# Patient Record
Sex: Female | Born: 1990 | Race: Black or African American | Hispanic: No | Marital: Single | Smoking: Never smoker
Health system: Southern US, Community
[De-identification: ages and names within clinical notes are randomized; demographics above are authoritative.]

## PROBLEM LIST (undated history)

## (undated) ENCOUNTER — Inpatient Hospital Stay (HOSPITAL_COMMUNITY): Payer: Self-pay

## (undated) DIAGNOSIS — A6000 Herpesviral infection of urogenital system, unspecified: Secondary | ICD-10-CM

## (undated) DIAGNOSIS — B9689 Other specified bacterial agents as the cause of diseases classified elsewhere: Secondary | ICD-10-CM

## (undated) DIAGNOSIS — R5600 Simple febrile convulsions: Secondary | ICD-10-CM

## (undated) DIAGNOSIS — N760 Acute vaginitis: Secondary | ICD-10-CM

---
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
## 2012-05-30 ENCOUNTER — Emergency Department (HOSPITAL_BASED_OUTPATIENT_CLINIC_OR_DEPARTMENT_OTHER)
Admission: EM | Admit: 2012-05-30 | Discharge: 2012-05-30 | Disposition: A | Discharge status: Home / Self Care | Payer: PPO | Attending: Emergency Medicine | Admitting: Emergency Medicine

## 2012-05-30 ENCOUNTER — Encounter (HOSPITAL_BASED_OUTPATIENT_CLINIC_OR_DEPARTMENT_OTHER): Payer: Self-pay | Admitting: Emergency Medicine

## 2012-05-30 DIAGNOSIS — N760 Acute vaginitis: Secondary | ICD-10-CM

## 2012-05-30 LAB — GC/CHLAMYDIA PROBE AMP: CT Probe RNA: NEGATIVE

## 2012-05-30 LAB — WET PREP, GENITAL: Yeast Wet Prep HPF POC: NONE SEEN

## 2012-05-30 MED ORDER — METRONIDAZOLE 500 MG PO TABS: 500.0000 mg | ORAL_TABLET | Freq: Two times a day (BID) | ORAL | Status: DC

## 2012-05-30 MED ORDER — METRONIDAZOLE IN NACL 5-0.79 MG/ML-% IV SOLN: 500.0000 mg | Freq: Once | INTRAVENOUS | Status: DC

## 2012-05-30 MED ORDER — METRONIDAZOLE 500 MG PO TABS
500.0000 mg | ORAL_TABLET | Freq: Once | ORAL | Status: AC
  Administered 2012-05-30: 500 mg via ORAL
  Filled 2012-05-30: qty 1

## 2012-05-30 NOTE — ED Notes (Signed)
Pt c/o vaginal discharge starting today.

## 2012-05-30 NOTE — ED Provider Notes (Signed)
History     CSN: [id number]  Arrival date & time 05/30/12  1324   First MD Initiated Contact with Patient 05/30/12 0057      Chief Complaint  Patient presents with  . Vaginal Discharge    (Consider location/radiation/quality/duration/timing/severity/associated sxs/prior treatment) HPI This is a 21 year old female complains of a one-day history of a malodorous white vaginal discharge. She states she gets this frequently and has been told that it is bacterial vaginosis. She has some mild associated pelvic pain which she also typically gets along with the bacterial vaginosis. She denies dysuria or vaginal bleeding.  History reviewed. No pertinent past medical history.  History reviewed. No pertinent past surgical history.  No family history on file.  History  Substance Use Topics  . Smoking status: Never Smoker   . Smokeless tobacco: Not on file  . Alcohol Use: Yes    OB History    Grav Para Term Preterm Abortions TAB SAB Ect Mult Living                  Review of Systems  All other systems reviewed and are negative.    Allergies  Review of patient's allergies indicates no known allergies.  Home Medications   Current Outpatient Rx  Name  Route  Sig  Dispense  Refill  . OVER THE COUNTER MEDICATION   Oral   Take 1 packet by mouth daily as needed. For pain            BP 105/53  Pulse 58  Temp 98.4 F (36.9 C) (Oral)  Resp 18  Ht 5\' 5"  (1.651 m)  Wt 170 lb (77.111 kg)  BMI 28.29 kg/m2  SpO2 98%  Physical Exam General: Well-developed, well-nourished female in no acute distress; appearance consistent with age of record HENT: normocephalic, atraumatic Eyes: Normal appearance Neck: supple Heart: regular rate and rhythm Lungs: clear to auscultation bilaterally Abdomen: soft; nondistended; mild suprapubic tenderness; bowel sounds present GU: Normal external genitalia; the vaginal bleeding; white vaginal discharge; no cervical motion tenderness; mild  bilateral adnexal tenderness Extremities: No deformity; full range of motion Neurologic: Awake, alert and oriented; motor function intact in all extremities and symmetric; no facial droop Skin: Warm and dry Psychiatric: Normal mood and affect    ED Course  Procedures (including critical care time)     MDM   Nursing notes and vitals signs, including pulse oximetry, reviewed.  Summary of this visit's results, reviewed by myself:  Labs:  Results for orders placed during the hospital encounter of 05/30/12 (from the past 24 hour(s))  WET PREP, GENITAL     Status: Abnormal   Collection Time   05/30/12  1:02 AM      Component Value Range   Yeast Wet Prep HPF POC NONE SEEN  NONE SEEN   Trich, Wet Prep NONE SEEN  NONE SEEN   Clue Cells Wet Prep HPF POC MODERATE (*) NONE SEEN   WBC, Wet Prep HPF POC FEW (*) NONE SEEN            Hanley Seamen, MD 05/30/12 0121

## 2012-05-30 NOTE — ED Notes (Signed)
Pt states she has recurrent BV. Attempted to educate pt about cause and prevention of BV. Pt not receptive to information given and states "you don't know what your talking about". No further attempt at education at this time.

## 2012-08-07 ENCOUNTER — Emergency Department (HOSPITAL_BASED_OUTPATIENT_CLINIC_OR_DEPARTMENT_OTHER)
Admission: EM | Admit: 2012-08-07 | Discharge: 2012-08-07 | Disposition: A | Discharge status: Home / Self Care | Payer: 59 | Attending: Emergency Medicine | Admitting: Emergency Medicine

## 2012-08-07 ENCOUNTER — Encounter (HOSPITAL_BASED_OUTPATIENT_CLINIC_OR_DEPARTMENT_OTHER): Payer: Self-pay | Admitting: *Deleted

## 2012-08-07 DIAGNOSIS — B9689 Other specified bacterial agents as the cause of diseases classified elsewhere: Secondary | ICD-10-CM

## 2012-08-07 DIAGNOSIS — N760 Acute vaginitis: Secondary | ICD-10-CM | POA: Insufficient documentation

## 2012-08-07 DIAGNOSIS — Z8669 Personal history of other diseases of the nervous system and sense organs: Secondary | ICD-10-CM | POA: Insufficient documentation

## 2012-08-07 HISTORY — DX: Other specified bacterial agents as the cause of diseases classified elsewhere: B96.89

## 2012-08-07 HISTORY — DX: Other specified bacterial agents as the cause of diseases classified elsewhere: N76.0

## 2012-08-07 HISTORY — DX: Simple febrile convulsions: R56.00

## 2012-08-07 LAB — WET PREP, GENITAL: Trich, Wet Prep: NONE SEEN

## 2012-08-07 MED ORDER — METRONIDAZOLE 0.75 % VA GEL: 1.0000 | Freq: Two times a day (BID) | VAGINAL | Status: DC

## 2012-08-07 NOTE — ED Notes (Signed)
Pt is unable to void. 

## 2012-08-07 NOTE — ED Provider Notes (Signed)
History     CSN: [id number]  Arrival date & time 08/07/12  1709   First MD Initiated Contact with Patient 08/07/12 1832      Chief Complaint  Patient presents with  . Vaginal Discharge    (Consider location/radiation/quality/duration/timing/severity/associated sxs/prior treatment) Patient is a 22 y.o. female presenting with vaginal discharge. The history is provided by the patient. No language interpreter was used.  Vaginal Discharge This is a new problem. The current episode started today. The problem occurs constantly. The problem has been unchanged. Nothing aggravates the symptoms. She has tried nothing for the symptoms. The treatment provided mild relief.   Pt complains of a thick white discharge.  Past Medical History  Diagnosis Date  . BV (bacterial vaginosis)   . Febrile seizure     History reviewed. No pertinent past surgical history.  No family history on file.  History  Substance Use Topics  . Smoking status: Never Smoker   . Smokeless tobacco: Not on file  . Alcohol Use: Yes    OB History    Grav Para Term Preterm Abortions TAB SAB Ect Mult Living                  Review of Systems  Genitourinary: Positive for vaginal discharge.  All other systems reviewed and are negative.    Allergies  Review of patient's allergies indicates no known allergies.  Home Medications   Current Outpatient Rx  Name  Route  Sig  Dispense  Refill  . METRONIDAZOLE 500 MG PO TABS   Oral   Take 1 tablet (500 mg total) by mouth 2 (two) times daily.   14 tablet   0   . OVER THE COUNTER MEDICATION   Oral   Take 1 packet by mouth daily as needed. For pain            BP 130/54  Temp 98.1 F (36.7 C) (Oral)  Resp 20  Ht 5\' 6"  (1.676 m)  Wt 170 lb (77.111 kg)  BMI 27.44 kg/m2  SpO2 100%  Physical Exam  Nursing note and vitals reviewed. Constitutional: She appears well-developed and well-nourished.  HENT:  Head: Normocephalic.  Eyes: Pupils are equal, round,  and reactive to light.  Neck: Normal range of motion.  Cardiovascular: Normal rate, regular rhythm and normal heart sounds.   Pulmonary/Chest: Effort normal and breath sounds normal.  Abdominal: Soft.  Genitourinary: Vaginal discharge found.       Scant white vaginal discharge  Musculoskeletal: Normal range of motion.  Neurological: She is alert.  Skin: Skin is warm.  Psychiatric: She has a normal mood and affect.    ED Course  Procedures (including critical care time)   Labs Reviewed  URINALYSIS, ROUTINE W REFLEX MICROSCOPIC  PREGNANCY, URINE   No results found.   No diagnosis found.    MDM   Pt was on flagyl 2 weeks ago.   Pt reports metrogel has worked better in the past       Lonia Skinner [place], [place] 08/07/12 1909

## 2012-08-07 NOTE — ED Notes (Addendum)
Patient states she noticed she has a cloudy white vaginal discharge this morning.  States she has a hx of BV and was seen and tx by her PCP 2-3 weeks ago for same.  Denies pain or urinary problems.

## 2012-08-08 LAB — GC/CHLAMYDIA PROBE AMP: GC Probe RNA: NEGATIVE

## 2012-08-11 NOTE — ED Provider Notes (Signed)
History/physical exam/procedure(s) were performed by non-physician practitioner and as supervising physician I was immediately available for consultation/collaboration. I have reviewed all notes and am in agreement with care and plan.   Hilario Quarry, MD 08/11/12 [phone]

## 2015-03-23 DIAGNOSIS — T8859XA Other complications of anesthesia, initial encounter: Secondary | ICD-10-CM

## 2015-03-23 HISTORY — DX: Other complications of anesthesia, initial encounter: T88.59XA

## 2015-08-05 LAB — HM PAP SMEAR: HM PAP: NORMAL

## 2016-09-19 DIAGNOSIS — B9689 Other specified bacterial agents as the cause of diseases classified elsewhere: Secondary | ICD-10-CM | POA: Insufficient documentation

## 2016-09-19 DIAGNOSIS — N760 Acute vaginitis: Secondary | ICD-10-CM

## 2016-10-07 DIAGNOSIS — A6000 Herpesviral infection of urogenital system, unspecified: Secondary | ICD-10-CM | POA: Insufficient documentation

## 2016-10-17 ENCOUNTER — Encounter: Payer: Self-pay | Admitting: Podiatry

## 2016-10-17 ENCOUNTER — Ambulatory Visit (INDEPENDENT_AMBULATORY_CARE_PROVIDER_SITE_OTHER): Payer: 59

## 2016-10-17 ENCOUNTER — Ambulatory Visit (INDEPENDENT_AMBULATORY_CARE_PROVIDER_SITE_OTHER): Payer: 59 | Admitting: Podiatry

## 2016-10-17 DIAGNOSIS — M2041 Other hammer toe(s) (acquired), right foot: Secondary | ICD-10-CM

## 2016-10-17 DIAGNOSIS — M2010 Hallux valgus (acquired), unspecified foot: Secondary | ICD-10-CM

## 2016-10-17 DIAGNOSIS — L84 Corns and callosities: Secondary | ICD-10-CM | POA: Diagnosis not present

## 2016-10-17 DIAGNOSIS — M2042 Other hammer toe(s) (acquired), left foot: Secondary | ICD-10-CM | POA: Diagnosis not present

## 2016-10-17 NOTE — Patient Instructions (Signed)

## 2016-10-19 NOTE — Progress Notes (Signed)
Subjective:     Patient ID: Joanne Santos, female   DOB: 02/07/1991, 26 y.o.   MRN: [id number]  HPI 26 year old female presents to the office today for concerns of a painful bunion to both of her feet with the left worse than right. She also has pain along a hammertoe to the 5th digits on both feet again with the left worse than the right. She has tried shoegear changes, offloading and padding without any resolution of symptoms. She is interested in surgical intervention. She states that her toes and feet hurt on a daily basis despite conservative treatment. She has no other complaints today.   Review of Systems  All other systems reviewed and are negative.      Objective:   Physical Exam General: AAO x3, NAD  Dermatological: Skin is warm, dry and supple bilateral. Nails x 10 are well manicured; remaining integument appears unremarkable at this time. There are no open sores, no preulcerative lesions, no rash or signs of infection present.  Vascular: Dorsalis Pedis artery and Posterior Tibial artery pedal pulses are 2/4 bilateral with immedate capillary fill time.  There is no pain with calf compression, swelling, warmth, erythema.   Neruologic: Grossly intact via light touch bilateral. Vibratory intact via tuning fork bilateral. Protective threshold with Semmes Wienstein monofilament intact to all pedal sites bilateral.   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 There is no pain or crepitation first MPJ range of motion. There is no hypermobility present the first ray. Adductovarus is present of bilateral fifth toes with the left side worse than the right and there is an associated hyperkeratotic lesion on the dorsal lateral aspect of the digit. There is no other areas of tenderness identified bilaterally. MMT 5/5. Range of motion intact.  Gait: Unassisted, Nonantalgic.      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     Plan:    -Treatment options discussed including all alternatives, risks, and complications -Etiology of symptoms were discussed -X-rays were obtained and reviewed with the patient. HAV is present as well as hammertoes the fifth digit. There is no evidence of acute fracture identified today. -I discussed both conservative and surgical treatment options with the patient. For now she will continue with padding offloading but she were to go ahead and proceed with surgical intervention as well. I discussed with her left foot bunion correction likely also bunionectomy with fifth digit hammertoe repair, PIPJ arthroplasty. She wishes to proceed.  -The incision placement as well as the postoperative course was discussed with the patient. I discussed risks of the surgery which include, but not limited to, infection, bleeding, pain, swelling, need for further surgery, delayed or nonhealing, painful or ugly scar, numbness or sensation changes, over/under correction, recurrence, transfer lesions, further deformity, hardware failure, DVT/PE, loss of toe/foot. Patient understands these risks and wishes to proceed with surgery. The surgical consent was reviewed with the patient all 3 pages were signed. No promises or guarantees were given to the outcome of the procedure. All questions were answered to the best of my ability. Before the surgery the patient was encouraged to call the office if there is any further questions. The surgery will be performed at the [facility] on an outpatient basis.  Ovid Curd, DPM

## 2017-02-21 NOTE — Progress Notes (Signed)
Patient presents to clinic today for the first time. She is currently a patient of Dr. Asencion Partridge who is going to be joining [place] in November. She will keep Dr. Mardelle Matte as PCP. Today is for acute concerns only.   Patient with history of genital herpes, first diagnosed a couple of years prior. Endorses very rare outbreaks. Notes they are associated with stressors. Has noted outbreak over the past two days associated with increased work stressors. Is not taking anything for symptoms.   Patient also with history of recurrent bacterial vaginosis. Having around 1 infection every 1-2 months. Is prescribed boric acid to use 1-2 x weekly for prevention. Is not using as directed. Also notes applying coconut oil and OTC antiviral tonic to the vagina. Is questioning if this is contributing to the BV. Notes discharge with mild itch and a fishy odor. Endorses prior successful treatments with Flagyl.   Is also requesting routine STD screening. She is sexually active with one female partner. Endorses condom use.   Past Medical History:  Diagnosis Date  . BV (bacterial vaginosis)   . Febrile seizure (HCC)     No current outpatient prescriptions on file prior to visit.   No current facility-administered medications on file prior to visit.     No Known Allergies  Family History  Problem Relation Age of Onset  . Early death Mother   . Diabetes Father   . Hypertension Father   . Diabetes Maternal Grandmother   . Stroke Maternal Grandmother   . Hypertension Sister     Social History   Social History  . Marital status: Single    Spouse name: N/A  . Number of children: N/A  . Years of education: N/A   Social History Main Topics  . Smoking status: Never Smoker  . Smokeless tobacco: Never Used  . Alcohol use Yes     Comment: liquor on weekends  . Drug use: No  . Sexual activity: Yes   Other Topics Concern  . None   Social History Narrative  . None   Review of Systems - See HPI.  All other  ROS are negative.  BP 110/70   Pulse 93   Temp 98.3 F (36.8 C) (Oral)   Resp 14   Ht 5\' 6"  (1.676 m)   Wt 192 lb (87.1 kg)   SpO2 98%   BMI 30.99 kg/m   Physical Exam  Constitutional: She is oriented to person, place, and time and well-developed, well-nourished, and in no distress.  HENT:  Head: Normocephalic and atraumatic.  Eyes: Conjunctivae are normal.  Neck: Neck supple.  Cardiovascular: Normal rate, regular rhythm, normal heart sounds and intact distal pulses.   Pulmonary/Chest: Effort normal and breath sounds normal. No respiratory distress. She has no wheezes. She has no rales. She exhibits no tenderness.  Genitourinary: Uterus normal, cervix normal, right adnexa normal and left adnexa normal. Vulva exhibits lesion (herpteic lesion or perianal region noted. Also start of lesion on lower right vulva). Thin  fishy  white and vaginal discharge found.  Neurological: She is alert and oriented to person, place, and time.  Skin: Skin is warm and dry. No rash noted.  Psychiatric: Affect normal.  Vitals reviewed.  Assessment/Plan: 1. Vaginal discharge + history of recurrent BV. Discharge on exam today with fishy odor. Will start Flagyl for BV. Wet prep sent today. Discussed cessation of natural oils she is applying to the vagina which can be contributing to recurrent BV. Supportive measures reviewed. -  Cervicovaginal ancillary only - metroNIDAZOLE (FLAGYL) 500 MG tablet; Take 1 tablet (500 mg total) by mouth 2 (two) times daily.  Dispense: 14 tablet; Refill: 0 - RPR; Future - HIV antibody; Future - RPR - HIV antibody  2. Screening examination for STD (sexually transmitted disease) Patient requesting routine STI panel today. Ordered.  - Cervicovaginal ancillary only - RPR; Future - HIV antibody; Future - RPR - HIV antibody  3. Herpes simplex vulvovaginitis Recurrent outbreak. Mild. Discussed supportive measures for prevention. Start Valtrex 1000 mg QD x 5 day.  -  valACYclovir (VALTREX) 1000 MG tablet; Take 1 tablet (1,000 mg total) by mouth daily.  Dispense: 5 tablet; Refill: 0 - RPR; Future - HIV antibody; Future - RPR - HIV antibody   Piedad Climes, PA-C

## 2017-02-22 ENCOUNTER — Ambulatory Visit (INDEPENDENT_AMBULATORY_CARE_PROVIDER_SITE_OTHER): Payer: 59 | Admitting: Physician Assistant

## 2017-02-22 ENCOUNTER — Encounter: Payer: Self-pay | Admitting: Physician Assistant

## 2017-02-22 ENCOUNTER — Other Ambulatory Visit (HOSPITAL_COMMUNITY)
Admission: RE | Admit: 2017-02-22 | Discharge: 2017-02-22 | Disposition: A | Discharge status: Home / Self Care | Payer: 59 | Source: Ambulatory Visit | Attending: Physician Assistant | Admitting: Physician Assistant

## 2017-02-22 VITALS — BP 110/70 | HR 93 | Temp 98.3°F | Resp 14 | Ht 66.0 in | Wt 192.0 lb

## 2017-02-22 DIAGNOSIS — N898 Other specified noninflammatory disorders of vagina: Secondary | ICD-10-CM

## 2017-02-22 DIAGNOSIS — A6004 Herpesviral vulvovaginitis: Secondary | ICD-10-CM | POA: Diagnosis not present

## 2017-02-22 DIAGNOSIS — Z113 Encounter for screening for infections with a predominantly sexual mode of transmission: Secondary | ICD-10-CM | POA: Diagnosis not present

## 2017-02-22 MED ORDER — VALACYCLOVIR HCL 1 G PO TABS: 1000.0000 mg | ORAL_TABLET | Freq: Every day | ORAL | 0 refills | Status: DC

## 2017-02-22 MED ORDER — METRONIDAZOLE 500 MG PO TABS: 500.0000 mg | ORAL_TABLET | Freq: Two times a day (BID) | ORAL | 0 refills | Status: DC

## 2017-02-22 NOTE — Patient Instructions (Signed)
Please go to the lab for blood work. I will call with your results.  Please start Valtrex one tablet daily x 5 days.  Also stop use of the oils to the area -- I feel this is contributing to recurrent BV. Use the Flagyl twice daily as directed. No alcohol while on medication.   Follow-up if symptoms are not resolving.

## 2017-02-22 NOTE — Progress Notes (Signed)
Pre visit review using our clinic review tool, if applicable. No additional management support is needed unless otherwise documented below in the visit note. 

## 2017-02-23 LAB — HIV ANTIBODY (ROUTINE TESTING W REFLEX): HIV Screen 4th Generation wRfx: NONREACTIVE

## 2017-02-23 LAB — CERVICOVAGINAL ANCILLARY ONLY: Wet Prep (BD Affirm): POSITIVE — AB

## 2017-02-23 LAB — RPR: RPR: NONREACTIVE

## 2017-03-13 ENCOUNTER — Ambulatory Visit: Payer: 59 | Admitting: Obstetrics & Gynecology

## 2017-04-10 ENCOUNTER — Other Ambulatory Visit: Payer: Self-pay | Admitting: Physician Assistant

## 2017-04-10 DIAGNOSIS — A6004 Herpesviral vulvovaginitis: Secondary | ICD-10-CM

## 2017-04-11 MED ORDER — VALACYCLOVIR HCL 1 G PO TABS: 1000.0000 mg | ORAL_TABLET | Freq: Every day | ORAL | 0 refills | Status: DC

## 2017-07-21 ENCOUNTER — Ambulatory Visit: Payer: 59 | Admitting: Physician Assistant

## 2017-07-27 ENCOUNTER — Other Ambulatory Visit: Payer: Self-pay | Admitting: Physician Assistant

## 2017-07-27 DIAGNOSIS — A6004 Herpesviral vulvovaginitis: Secondary | ICD-10-CM

## 2017-07-27 DIAGNOSIS — N898 Other specified noninflammatory disorders of vagina: Secondary | ICD-10-CM

## 2017-07-27 NOTE — Telephone Encounter (Signed)
Pt requesting refills for Metronidazole and Valtrex. Last seen August by Selena Battenody. Please advise if okay to refill?

## 2017-07-31 ENCOUNTER — Encounter: Payer: Self-pay | Admitting: Family Medicine

## 2017-07-31 ENCOUNTER — Ambulatory Visit (INDEPENDENT_AMBULATORY_CARE_PROVIDER_SITE_OTHER): Payer: Commercial Managed Care - PPO | Admitting: Family Medicine

## 2017-07-31 ENCOUNTER — Other Ambulatory Visit: Payer: Self-pay

## 2017-07-31 VITALS — BP 102/68 | HR 85 | Temp 99.0°F | Ht 66.0 in | Wt 182.0 lb

## 2017-07-31 DIAGNOSIS — B9689 Other specified bacterial agents as the cause of diseases classified elsewhere: Secondary | ICD-10-CM | POA: Diagnosis not present

## 2017-07-31 DIAGNOSIS — Z7251 High risk heterosexual behavior: Secondary | ICD-10-CM

## 2017-07-31 DIAGNOSIS — N760 Acute vaginitis: Secondary | ICD-10-CM | POA: Diagnosis not present

## 2017-07-31 MED ORDER — METRONIDAZOLE 500 MG PO TABS: 500.0000 mg | ORAL_TABLET | Freq: Two times a day (BID) | ORAL | 0 refills | Status: AC

## 2017-07-31 NOTE — Progress Notes (Signed)
Subjective  CC:  Chief Complaint  Patient presents with  . Vaginal Discharge    White in Color after Intercourse     HPI: Joanne Santos is a 27 y.o. female who presents to the office today to address the problems listed above in the chief complaint.  Patient presents for evaluation of an abnormal vaginal discharge: she describes  thin and malodorous without itching or vaginal sores. . Sexually transmitted infection risk: Possible STD exposure. The patient reports a past history of: herpes.. She declines serology testing and HIV testing today but will test at next due cpe. HIV and RPR were negative in august. Sleeping with one female partner w/o condoms. She denies urinary sxs. She uses Nexplanon for birth control.  I reviewed the patients updated PMH, FH, and SocHx.    Patient Active Problem List   Diagnosis Date Noted  . Genital herpes 10/07/2016  . Bacterial vaginosis 09/19/2016   Current Meds  Medication Sig  . BORIC ACID EX Use supp nightly x 5 nights then 2-3x/ week as needed  . etonogestrel (NEXPLANON) 68 MG IMPL implant Nexplanon 68 mg subdermal implant  Inject 1 implant by subcutaneous route.    Allergies: Patient has No Known Allergies. Family History: Patient family history includes Diabetes in her father and maternal grandmother; Early death in her mother; Hypertension in her father and sister; Stroke in her maternal grandmother. Social History:  Patient  reports that  has never smoked. she has never used smokeless tobacco. She reports that she drinks alcohol. She reports that she does not use drugs.  Review of Systems: Constitutional: Negative for fever malaise or anorexia Cardiovascular: negative for chest pain Respiratory: negative for SOB or persistent cough Gastrointestinal: negative for abdominal pain  Objective  Vitals: BP 102/68   Pulse 85   Temp 99 F (37.2 C)   Ht 5\' 6"  (1.676 m)   Wt 182 lb (82.6 kg)   SpO2 100%   BMI 29.38 kg/m  General: no  acute distress , A&Ox3 HEENT: PEERL, conjunctiva normal, Oropharynx moist,neck is supple Cardiovascular:  RRR without murmur or gallop.  Gastrointestinal: soft, flat abdomen, normal active bowel sounds, no palpable masses, no hepatosplenomegaly, no appreciated hernias GYN: thin homogenous discharge present. Clear cervix, no cmt, nontender fundus, no adnexal ttp Skin:  Warm, no rashes  Assessment  1. Bacterial vaginosis   2. High risk heterosexual behavior      Plan   BV:  Recurrent but much less frequent on Boric acid. Treat with flagyl and await testing.   cpe next visit with STD screening.   Discussed safe sex counseling; rec condom use always.   Follow up: cpe 1 month    Commons side effects, risks, benefits, and alternatives for medications and treatment plan prescribed today were discussed, and the patient expressed understanding of the given instructions. Patient is instructed to call or message via MyChart if he/she has any questions or concerns regarding our treatment plan. No barriers to understanding were identified. We discussed Red Flag symptoms and signs in detail. Patient expressed understanding regarding what to do in case of urgent or emergency type symptoms.   Medication list was reconciled, printed and provided to the patient in AVS. Patient instructions and summary information was reviewed with the patient as documented in the AVS. This note was prepared with assistance of Dragon voice recognition software. Occasional wrong-word or sound-a-like substitutions may have occurred due to the inherent limitations of voice recognition software  No orders of the  defined types were placed in this encounter.  Meds ordered this encounter  Medications  . metroNIDAZOLE (FLAGYL) 500 MG tablet    Sig: Take 1 tablet (500 mg total) by mouth 2 (two) times daily for 7 days.    Dispense:  14 tablet    Refill:  0

## 2017-07-31 NOTE — Patient Instructions (Signed)
It was so good seeing you again! Thank you for establishing with my new practice and allowing me to continue caring for you. It means a lot to me.   Please schedule a follow up appointment with me in 1-6 months for your complete physical.   Complete your antibiotics.   Use your boric acid suppositories twice weekly to prevent infection.   I will release your lab results to you on your MyChart account with further instructions. Please reply with any questions.

## 2017-08-01 ENCOUNTER — Telehealth: Payer: Self-pay | Admitting: Family Medicine

## 2017-08-01 NOTE — Telephone Encounter (Signed)
Cytology reports that the incorrect swab was used for pts wet prep.  They state that they were sent a [place] Swab and it should have been an Aptima Swab.    Provider/Care Team has been informed.

## 2017-08-01 NOTE — Telephone Encounter (Signed)
Noted. Cancel order. Pt was treated for bv.

## 2017-08-01 NOTE — Telephone Encounter (Signed)
Please call the lab @ [facility] [phone]

## 2017-08-07 ENCOUNTER — Encounter: Payer: Self-pay | Admitting: Family Medicine

## 2017-08-07 ENCOUNTER — Telehealth: Payer: Self-pay | Admitting: Emergency Medicine

## 2017-08-07 NOTE — Telephone Encounter (Signed)
Patient informed that wrong culture tube was sent to lab, Patient reports that all symptoms have gone away after she finished Antibiotics. Discussed with patient if symptoms return call office for reevaluation. Patient verbalized understanding. AP, CMA

## 2017-08-07 NOTE — Telephone Encounter (Signed)
Patient informed that wrong culture tube was sent to lab, Patient reports that all symptoms have gone away after she finished Antibiotics. Discussed with patient if symptoms return call office for reevaluation. Patient verbalized understanding. AP, CMA  

## 2017-08-17 ENCOUNTER — Encounter: Payer: Commercial Managed Care - PPO | Admitting: Family Medicine

## 2017-11-30 ENCOUNTER — Encounter: Payer: Self-pay | Admitting: Family Medicine

## 2017-11-30 ENCOUNTER — Ambulatory Visit (INDEPENDENT_AMBULATORY_CARE_PROVIDER_SITE_OTHER): Payer: Commercial Managed Care - PPO | Admitting: Family Medicine

## 2017-11-30 ENCOUNTER — Other Ambulatory Visit (HOSPITAL_COMMUNITY)
Admission: RE | Admit: 2017-11-30 | Discharge: 2017-11-30 | Disposition: A | Discharge status: Home / Self Care | Payer: Commercial Managed Care - PPO | Source: Ambulatory Visit | Attending: Family Medicine | Admitting: Family Medicine

## 2017-11-30 ENCOUNTER — Other Ambulatory Visit: Payer: Self-pay

## 2017-11-30 VITALS — BP 98/62 | HR 68 | Temp 98.2°F | Ht 66.0 in | Wt 176.0 lb

## 2017-11-30 DIAGNOSIS — Z975 Presence of (intrauterine) contraceptive device: Secondary | ICD-10-CM | POA: Insufficient documentation

## 2017-11-30 DIAGNOSIS — N898 Other specified noninflammatory disorders of vagina: Secondary | ICD-10-CM

## 2017-11-30 DIAGNOSIS — Z113 Encounter for screening for infections with a predominantly sexual mode of transmission: Secondary | ICD-10-CM

## 2017-11-30 DIAGNOSIS — Z0000 Encounter for general adult medical examination without abnormal findings: Secondary | ICD-10-CM

## 2017-11-30 LAB — CBC WITH DIFFERENTIAL/PLATELET
BASOS ABS: 0 10*3/uL (ref 0.0–0.1)
Basophils Relative: 0.5 % (ref 0.0–3.0)
Eosinophils Absolute: 0 10*3/uL (ref 0.0–0.7)
Eosinophils Relative: 0.5 % (ref 0.0–5.0)
HEMATOCRIT: 38.8 % (ref 36.0–46.0)
Hemoglobin: 13 g/dL (ref 12.0–15.0)
LYMPHS ABS: 1.5 10*3/uL (ref 0.7–4.0)
LYMPHS PCT: 50.7 % — AB (ref 12.0–46.0)
MCHC: 33.5 g/dL (ref 30.0–36.0)
MCV: 91 fl (ref 78.0–100.0)
Monocytes Absolute: 0.4 10*3/uL (ref 0.1–1.0)
Monocytes Relative: 14.9 % — ABNORMAL HIGH (ref 3.0–12.0)
NEUTROS ABS: 1 10*3/uL — AB (ref 1.4–7.7)
NEUTROS PCT: 33.4 % — AB (ref 43.0–77.0)
PLATELETS: 255 10*3/uL (ref 150.0–400.0)
RBC: 4.26 Mil/uL (ref 3.87–5.11)
RDW: 12.7 % (ref 11.5–15.5)
WBC: 2.9 10*3/uL — ABNORMAL LOW (ref 4.0–10.5)

## 2017-11-30 LAB — COMPREHENSIVE METABOLIC PANEL
ALT: 12 U/L (ref 0–35)
AST: 11 U/L (ref 0–37)
Albumin: 4.3 g/dL (ref 3.5–5.2)
Alkaline Phosphatase: 33 U/L — ABNORMAL LOW (ref 39–117)
BILIRUBIN TOTAL: 0.2 mg/dL (ref 0.2–1.2)
BUN: 12 mg/dL (ref 6–23)
CO2: 22 meq/L (ref 19–32)
CREATININE: 0.62 mg/dL (ref 0.40–1.20)
Calcium: 9.2 mg/dL (ref 8.4–10.5)
Chloride: 106 mEq/L (ref 96–112)
GFR: 151.17 mL/min (ref >60.00)
GLUCOSE: 97 mg/dL (ref 70–99)
Potassium: 4.3 mEq/L (ref 3.5–5.1)
Sodium: 136 mEq/L (ref 135–145)
Total Protein: 7.9 g/dL (ref 6.0–8.3)

## 2017-11-30 LAB — LIPID PANEL
Cholesterol: 166 mg/dL (ref 0–200)
HDL: 47.4 mg/dL (ref >39.00)
LDL CALC: 108 mg/dL — AB (ref 0–99)
NonHDL: 118.2
Total CHOL/HDL Ratio: 3
Triglycerides: 49 mg/dL (ref 0.0–149.0)
VLDL: 9.8 mg/dL (ref 0.0–40.0)

## 2017-11-30 NOTE — Progress Notes (Signed)
Subjective  Chief Complaint  Patient presents with  . Annual Exam    doing well, HM Up to date  . Vaginal Discharge    x 2-3 days, white, clumpy, no itching, no odor     HPI: Joanne Santos is a 27 y.o. female who presents to [facility] today for a Female Wellness Visit.   Wellness Visit: annual visit with health maintenance review and exam without Pap   27 yo single G1P1001, son is 2 1/2 yo, working full time as Corporate investment banker for [employer] doing well overall. Only concern is recurrent vaginal d/c. Has h/o recurrent BV. No odor or irritative sxs, x 3 days. Has nexplanon in place for birth control. Rare menses. She is sexually active. No sores or lumps or known STD exposure.    Lifestyle: Body mass index is 28.41 kg/m. Wt Readings from Last 3 Encounters:  11/30/17 176 lb (79.8 kg)  07/31/17 182 lb (82.6 kg)  02/22/17 192 lb (87.1 kg)   Diet: general Exercise: frequently,  Need for contraception: Yes, Nexplanon  Patient Active Problem List   Diagnosis Date Noted  . Presence of subdermal contraceptive  - nexplanon 2017 11/30/2017  . Genital herpes 10/07/2016    Overview:  By pcr from lesion   . Bacterial vaginosis 09/19/2016   Health Maintenance  Topic Date Due  . INFLUENZA VACCINE  04/02/2018 (Originally 02/01/2018)  . PAP SMEAR  08/04/2018  . TETANUS/TDAP  12/18/2022  . HIV Screening  Completed   Immunization History  Administered Date(s) Administered  . HPV Quadrivalent 12/14/2007, 11/03/2009, 12/17/2012  . Tdap 12/17/2012   We updated and reviewed the patient's past history in detail and it is documented below. Allergies: Patient has No Known Allergies. Past Medical History Patient  has a past medical history of BV (bacterial vaginosis) and Febrile seizure (HCC). Past Surgical History Patient  has a past surgical history that includes Cesarean section (2016). Family History: Patient family history includes Diabetes in her  father and maternal grandmother; Early death in her mother; Hypertension in her father and sister; Stroke in her maternal grandmother. Social History:  Patient  reports that she has never smoked. She has never used smokeless tobacco. She reports that she drinks alcohol. She reports that she does not use drugs.  Review of Systems: Constitutional: negative for fever or malaise Ophthalmic: negative for photophobia, double vision or loss of vision Cardiovascular: negative for chest pain, dyspnea on exertion, or new LE swelling Respiratory: negative for SOB or persistent cough Gastrointestinal: negative for abdominal pain, change in bowel habits or melena Genitourinary: negative for dysuria or gross hematuria, no abnormal uterine bleeding or disharge Musculoskeletal: negative for new gait disturbance or muscular weakness Integumentary: negative for new or persistent rashes, no breast lumps Neurological: negative for TIA or stroke symptoms Psychiatric: negative for SI or delusions Allergic/Immunologic: negative for hives Patient Care Team    Relationship Specialty Notifications Start End  Willow Ora, MD PCP - General Family Medicine  04/11/17     Objective  Vitals: BP 98/62   Pulse 68   Temp 98.2 F (36.8 C)   Ht  (1.676 m)   Wt 176 lb (79.8 kg)   LMP 10/09/2017 (Exact Date)   BMI 28.41 kg/m  General:  Well developed, well nourished, no acute distress  Psych:  Alert and orientedx3,normal mood and affect HEENT:  Normocephalic, atraumatic, non-icteric sclera, PERRL, oropharynx is clear without mass or exudate, supple neck without adenopathy, mass  or thyromegaly Cardiovascular:  Normal S1, S2, RRR without gallop, rub or murmur, nondisplaced PMI Respiratory:  Good breath sounds bilaterally, CTAB with normal respiratory effort Gastrointestinal: normal bowel sounds, soft, non-tender, no noted masses. No HSM MSK: no deformities, contusions. Joints are without erythema or swelling.  Spine and CVA region are nontender Skin:  Warm, no rashes or suspicious lesions noted Neurologic:    Mental status is normal. CN 2-11 are normal. Gross motor and sensory exams are normal. Normal gait. No tremor Breast Exam: No mass, skin retraction or nipple discharge is appreciated in either breast. No axillary adenopathy. Fibrocystic changes are not noted  Assessment  1. Annual physical exam   2. Screen for STD (sexually transmitted disease)   3. Vaginal discharge      Plan  Female Wellness Visit:  Age appropriate Health Maintenance and Prevention measures were discussed with patient. Included topics are cancer screening recommendations, ways to keep healthy (see AVS) including dietary and exercise recommendations, regular eye and dental care, use of seat belts, and avoidance of moderate alcohol use and tobacco use. Discussed stress reducing activities.   BMI: discussed patient's BMI and encouraged positive lifestyle modifications to help get to or maintain a target BMI.  HM needs and immunizations were addressed and ordered. See below for orders. See HM and immunization section for updates.  Routine labs and screening tests ordered including cmp, cbc and lipids where appropriate.  Discussed recommendations regarding Vit D and calcium supplementation (see AVS)  Check for etiology of discharge; may be physiologic. Boric acid prn. Safe sex counseling given.   Follow up: Return in about 1 year (around 12/01/2018) for complete physical.    Commons side effects, risks, benefits, and alternatives for medications and treatment plan prescribed today were discussed, and the patient expressed understanding of the given instructions. Patient is instructed to call or message via MyChart if he/she has any questions or concerns regarding our treatment plan. No barriers to understanding were identified. We discussed Red Flag symptoms and signs in detail. Patient expressed understanding regarding what  to do in case of urgent or emergency type symptoms.   Medication list was reconciled, printed and provided to the patient in AVS. Patient instructions and summary information was reviewed with the patient as documented in the AVS. This note was prepared with assistance of Dragon voice recognition software. Occasional wrong-word or sound-a-like substitutions may have occurred due to the inherent limitations of voice recognition software  Orders Placed This Encounter  Procedures  . HIV antibody   No orders of the defined types were placed in this encounter.

## 2017-11-30 NOTE — Patient Instructions (Addendum)
Please return in 12 months for your annual complete physical; please come fasting.  Practice Deep Breathing exercises! Practice Mindfullness        Check out the App called CALM    If you have any questions or concerns, please don't hesitate to send me a message via MyChart or call the office at [phone]. Thank you for visiting with [place] today! It's our pleasure caring for you.  Please do these things to maintain good health!   Exercise at least 30-45 minutes a day,  4-5 days a week.   Eat a low-fat diet with lots of fruits and vegetables, up to 7-9 servings per day.  Drink plenty of water daily. Try to drink 8 8oz glasses per day.  Seatbelts can save your life. Always wear your seatbelt.  Place Smoke Detectors on every level of your home and check batteries every year.  Schedule an appointment with an eye doctor for an eye exam every 1-2 years  Safe sex - use condoms to protect yourself from STDs if you could be exposed to these types of infections. Use birth control if you do not want to become pregnant and are sexually active.  Avoid heavy alcohol use. If you drink, keep it to less than 2 drinks/day and not every day.  Health Care Power of Attorney.  Choose someone you trust that could speak for you if you became unable to speak for yourself.  Depression is common in our stressful world.If you're feeling down or losing interest in things you normally enjoy, please come in for a visit.  If anyone is threatening or hurting you, please get help. Physical or Emotional Violence is never OK.   Preventing Sexually Transmitted Infections, Adult Sexually transmitted infections (STIs) are diseases that are passed (transmitted) from person to person through bodily fluids exchanged during sex or sexual contact. Bodily fluids include saliva, semen, blood, vaginal mucus, and urine. You may have an increased risk for developing an STI if you have unprotected oral, vaginal, or anal sex. Some  common STIs include: Herpes. Hepatitis B. Chlamydia. Gonorrhea. Syphilis. HPV (human papillomavirus). HIV (humanimmunodeficiency virus), the virus that can cause AIDS (acquired immunodeficiency virus).  How can I protect myself from sexually transmitted infections? The only way to completely prevent STIs is not to have sex of any kind (practice abstinence). This includes oral, vaginal, or anal sex. If you are sexually active, take these actions to lower your risk of getting an STI: Have only one sex partner (be monogamous) or limit the number of sexual partners you have. Stay up-to-date on immunizations. Certain vaccines can lower your risk of getting certain STIs, such as: Hepatitis A and B vaccines. You may have been vaccinated as a young child, but likely need a booster shot as a teen or young adult. HPV vaccine. This vaccine is recommended if you are a man under age 45 or a woman under age 22. Use methods that prevent the exchange of body fluids between partners (barrier protection) every time you have sex. Barrier protection can be used during oral, vaginal, or anal sex. Commonly used barrier methods include: Female condom. Female condom. Dental dam. Get tested regularly for STIs. Have your sexual partner get tested regularly as well. Avoid mixing alcohol, drugs, and sex. Alcohol and drug use can affect your ability to make good decisions and can lead to risky sexual behaviors. Ask your health care provider about taking pre-exposure prophylaxis (PrEP) to prevent HIV infection if you: Have a HIV-positive  sexual partner. Have multiple sexual partners or partners who do not know their HIV status, and do not regularly use a condom during sex. Use injection drugs and share needles.  Birth control pills, injections, implants, and intrauterine devices (IUDs) do not protect against STIs. To prevent both STIs and pregnancy, always use a condom with another form of birth control. Some STIs, such  as herpes, are spread through skin to skin contact. A condom does not protect you from getting such STIs. If you or your partner have herpes and there is an active flare with open sores, avoid all sexual contact. Why are these changes important? Taking steps to practice safe sex protects you and others. Many STIs can be cured. However, some STIs are not curable and will affect you for the rest of your life. STIs can be passed on to another person even if you do not have symptoms. What can happen if changes are not made? Certain STIs may: Require you to take medicine for the rest of your life. Affect your ability to have children (your fertility). Increase your risk for developing another STI or certain serious health conditions, such as: Cervical cancer. Head and neck cancer. Pelvic inflammatory disease (PID) in women. Organ damage or damage to other parts of your body, if the infection spreads. Be passed to a baby during childbirth.  How are sexually transmitted infections treated? If you or your partner know or think that you may have an STI: Talk with your healthcare provider about what can be done to treat it. Some STIs can be treated and cured with medicines. For curable STIs, you and your partner should avoid sex during treatment and for several days after treatment is complete. You and your partner should both be treated at the same time, if there is any chance that your partner is infected as well. If you get treatment but your partner does not, your partner can re-infect you when you resume sexual contact. Do not have unprotected sex.  Where to find more information: Learn more about sexually transmitted diseases and infections from: Centers for Disease Control and Prevention: More information about specific STIs: SolutionApps.co.za Find places to get sexual health counseling and treatment for free or for a low cost: gettested.TonerPromos.no U.S. Department of Health and Human Services:  NotebookPreviews.si.html  Summary The only way to completely prevent STIs is not to have sex (practice abstinence), including oral, vaginal, or anal sex. STIs can spread through saliva, semen, blood, vaginal mucus, urine, or sexual contact. If you do have sex, limit your number of sexual partners and use a barrier protection method every time you have sex. If you develop an STI, get treated right away and ask your partner to be treated as well. Do not resume having sex until both of you have completed treatment for the STI. This information is not intended to replace advice given to you by your health care provider. Make sure you discuss any questions you have with your health care provider. Document Released: 06/16/2016 Document Revised: 06/16/2016 Document Reviewed: 06/16/2016 Elsevier Interactive Patient Education  [employer].

## 2017-12-01 ENCOUNTER — Telehealth: Payer: Self-pay | Admitting: Family Medicine

## 2017-12-01 LAB — HIV ANTIBODY (ROUTINE TESTING W REFLEX): HIV: NONREACTIVE

## 2017-12-01 LAB — CERVICOVAGINAL ANCILLARY ONLY
Bacterial vaginitis: POSITIVE — AB
CANDIDA VAGINITIS: POSITIVE — AB
TRICH (WINDOWPATH): NEGATIVE

## 2017-12-01 NOTE — Telephone Encounter (Signed)
Copied from CRM [id number]. Topic: Quick Communication - See Telephone Encounter >> Dec 01, 2017  9:32 AM Diana Eves B wrote: CRM for notification. See Telephone encounter for: 12/01/17.  Pt calling it to confirm the pap swab she left on the table was picked up and sent for testing.

## 2017-12-01 NOTE — Telephone Encounter (Signed)
Spoke with patient regarding cervical swab. Advised patient that we sent to lab for processing and we will call patient when results are in.   Patient verbalized understanding.   Kathi Simpers,  LPN

## 2017-12-04 MED ORDER — FLUCONAZOLE 150 MG PO TABS: ORAL_TABLET | ORAL | 0 refills | Status: DC

## 2017-12-04 MED ORDER — METRONIDAZOLE 500 MG PO TABS: 500.0000 mg | ORAL_TABLET | Freq: Two times a day (BID) | ORAL | 0 refills | Status: AC

## 2017-12-04 NOTE — Addendum Note (Signed)
Addended by: Asencion PartridgeANDY, Carolyne Whitsel on: 12/04/2017 04:54 PM   Modules accepted: Orders

## 2017-12-25 ENCOUNTER — Encounter: Payer: Self-pay | Admitting: Family Medicine

## 2017-12-25 ENCOUNTER — Other Ambulatory Visit: Payer: Commercial Managed Care - PPO

## 2017-12-27 MED ORDER — BORIC ACID CRYS: CRYSTALS | 5 refills | Status: DC

## 2017-12-27 NOTE — Addendum Note (Signed)
Addended by: Asencion PartridgeANDY, CAMILLE on: 12/27/2017 12:58 PM   Modules accepted: Orders

## 2018-01-03 ENCOUNTER — Encounter: Payer: Self-pay | Admitting: Emergency Medicine

## 2018-01-03 ENCOUNTER — Encounter: Payer: Self-pay | Admitting: Family Medicine

## 2018-01-03 ENCOUNTER — Other Ambulatory Visit: Payer: Self-pay

## 2018-01-03 ENCOUNTER — Ambulatory Visit: Payer: Commercial Managed Care - PPO | Admitting: Family Medicine

## 2018-01-03 VITALS — BP 100/70 | HR 75 | Temp 99.1°F | Ht 66.0 in | Wt 173.2 lb

## 2018-01-03 DIAGNOSIS — D729 Disorder of white blood cells, unspecified: Secondary | ICD-10-CM

## 2018-01-03 DIAGNOSIS — R590 Localized enlarged lymph nodes: Secondary | ICD-10-CM | POA: Diagnosis not present

## 2018-01-03 DIAGNOSIS — M542 Cervicalgia: Secondary | ICD-10-CM

## 2018-01-03 LAB — CBC WITH DIFFERENTIAL/PLATELET
BASOS PCT: 0.4 % (ref 0.0–3.0)
Basophils Absolute: 0 10*3/uL (ref 0.0–0.1)
EOS ABS: 0 10*3/uL (ref 0.0–0.7)
EOS PCT: 0.5 % (ref 0.0–5.0)
HEMATOCRIT: 38.3 % (ref 36.0–46.0)
HEMOGLOBIN: 13 g/dL (ref 12.0–15.0)
LYMPHS PCT: 50.5 % — AB (ref 12.0–46.0)
Lymphs Abs: 2 10*3/uL (ref 0.7–4.0)
MCHC: 34.1 g/dL (ref 30.0–36.0)
MCV: 90.3 fl (ref 78.0–100.0)
Monocytes Absolute: 0.8 10*3/uL (ref 0.1–1.0)
Monocytes Relative: 21.2 % — ABNORMAL HIGH (ref 3.0–12.0)
Neutro Abs: 1.1 10*3/uL — ABNORMAL LOW (ref 1.4–7.7)
Neutrophils Relative %: 27.4 % — ABNORMAL LOW (ref 43.0–77.0)
PLATELETS: 236 10*3/uL (ref 150.0–400.0)
RBC: 4.24 Mil/uL (ref 3.87–5.11)
RDW: 12.9 % (ref 11.5–15.5)
WBC: 3.9 10*3/uL — AB (ref 4.0–10.5)

## 2018-01-03 LAB — POCT MONO (EPSTEIN BARR VIRUS): Mono, POC: NEGATIVE

## 2018-01-03 NOTE — Patient Instructions (Addendum)
Please follow up if symptoms do not improve or as needed.   We will call you with information regarding your referral appointment. Ultrasound of your neck.  If you do not hear from us within the next 2 weeks, please let me know. It can take 1-2 weeks to get appointments set up with the specialists.   Take advil 2 tablets up to 3x/day as needed for neck pain.  I will release your lab results to you on your MyChart account with further instructions. Please reply with any questions.

## 2018-01-03 NOTE — Progress Notes (Signed)
Subjective  CC:  Chief Complaint  Patient presents with  . Neck Pain    patient states she has had neck pain since may     HPI: Joanne Santos is a 27 y.o. female who presents to the office today to address the problems listed above in the chief complaint.  Pt reports that a few days after her physical 5/30 she noted left cervical LN swelling and a ST. No fever. Since, the lymph node swelling and pain persists. Now sore to move neck. No trauma. No longer with sore throat. Energy level is fine. No scalp or skin infections. No arm swelling. No cough. Has tried tylenol on occ with mild relief. No radiation of pain. No h/o neck pain.  Vaginitis: resolved with tx for BV and yeast. She will use boric acid as needed for recurrent bv   Assessment  1. Posterior cervical lymphadenopathy   2. Abnormal WBC count   3. Neck pain on right side      Plan   Neck adenopathy:  Check labs and ultrasound. Suspect reactive nodes but will image to be sure due to persistent pain and recent abnl WBC. Educated patient. advil for pain.   Follow up: Return if symptoms worsen or fail to improve.   Orders Placed This Encounter  Procedures  . US Soft Tissue Head/Neck  . CBC with Differential/Platelet  . POCT Mono (Epstein Barr Virus)   No orders of the defined types were placed in this encounter.     I reviewed the patients updated PMH, FH, and SocHx.    Patient Active Problem List   Diagnosis Date Noted  . Presence of subdermal contraceptive  - nexplanon 2017 11/30/2017  . Genital herpes 10/07/2016  . Bacterial vaginosis 09/19/2016   Current Meds  Medication Sig  . Boric Acid CRYS Use supp nightly x 5 nights then 2-3x/ week as needed  . etonogestrel (NEXPLANON) 68 MG IMPL implant Nexplanon 68 mg subdermal implant  Inject 1 implant by subcutaneous route.  . [DISCONTINUED] fluconazole (DIFLUCAN) 150 MG tablet Take one tablet today; may repeat in 3 days if symptoms persist     Allergies: Patient has No Known Allergies. Family History: Patient family history includes Diabetes in her father and maternal grandmother; Early death in her mother; Hypertension in her father and sister; Stroke in her maternal grandmother. Social History:  Patient  reports that she has never smoked. She has never used smokeless tobacco. She reports that she drinks alcohol. She reports that she does not use drugs.  Review of Systems: Constitutional: Negative for fever malaise or anorexia Cardiovascular: negative for chest pain Respiratory: negative for SOB or persistent cough Gastrointestinal: negative for abdominal pain  Objective  Vitals: BP 100/70   Pulse 75   Temp 99.1 F (37.3 C)   Ht 5\' 6"  (1.676 m)   Wt 173 lb 3.2 oz (78.6 kg)   SpO2 99%   BMI 27.96 kg/m  General: no acute distress , A&Ox3 HEENT: PEERL, conjunctiva normal, Oropharynx moist clear,neck is supple but pain with left rotation; + left posterior cervical LAD that is tender, no supraclavicular nodes. TMs clear Cardiovascular:  RRR without murmur or gallop.  Respiratory:  Good breath sounds bilaterally, CTAB with normal respiratory effort Skin:  Warm, no rashes   Commons side effects, risks, benefits, and alternatives for medications and treatment plan prescribed today were discussed, and the patient expressed understanding of the given instructions. Patient is instructed to call or message via MyChart if  he/she has any questions or concerns regarding our treatment plan. No barriers to understanding were identified. We discussed Red Flag symptoms and signs in detail. Patient expressed understanding regarding what to do in case of urgent or emergency type symptoms.   Medication list was reconciled, printed and provided to the patient in AVS. Patient instructions and summary information was reviewed with the patient as documented in the AVS. This note was prepared with assistance of Dragon voice recognition  software. Occasional wrong-word or sound-a-like substitutions may have occurred due to the inherent limitations of voice recognition software

## 2018-01-04 ENCOUNTER — Encounter: Payer: Self-pay | Admitting: Family Medicine

## 2018-01-06 ENCOUNTER — Encounter: Payer: Self-pay | Admitting: Family Medicine

## 2018-01-10 ENCOUNTER — Ambulatory Visit
Admission: RE | Admit: 2018-01-10 | Discharge: 2018-01-10 | Disposition: A | Discharge status: Home / Self Care | Payer: Commercial Managed Care - PPO | Source: Ambulatory Visit | Attending: Family Medicine | Admitting: Family Medicine

## 2018-01-10 DIAGNOSIS — M542 Cervicalgia: Secondary | ICD-10-CM

## 2018-01-10 DIAGNOSIS — R590 Localized enlarged lymph nodes: Secondary | ICD-10-CM

## 2018-01-11 MED ORDER — TRAMADOL HCL 50 MG PO TABS: 50.0000 mg | ORAL_TABLET | Freq: Three times a day (TID) | ORAL | 0 refills | Status: DC | PRN

## 2018-01-11 MED ORDER — CYCLOBENZAPRINE HCL 10 MG PO TABS: 10.0000 mg | ORAL_TABLET | Freq: Three times a day (TID) | ORAL | 0 refills | Status: DC | PRN

## 2018-01-11 NOTE — Telephone Encounter (Signed)
Please call patient to clarify pharmacy; then send in meds

## 2018-01-11 NOTE — Addendum Note (Signed)
Addended by: Asencion PartridgeANDY, Bryannah Boston on: 01/11/2018 11:10 AM   Modules accepted: Orders

## 2018-01-11 NOTE — Telephone Encounter (Signed)
[facility] [address]

## 2018-01-11 NOTE — Addendum Note (Signed)
Addended by: Asencion PartridgeANDY, CAMILLE on: 01/11/2018 11:51 AM   Modules accepted: Orders

## 2018-01-11 NOTE — Addendum Note (Signed)
Addended byDene Gentry: PETERMAN, AMY M on: 01/11/2018 11:42 AM   Modules accepted: Orders

## 2018-01-11 NOTE — Addendum Note (Signed)
Addended byDene Gentry: Laron Angelini M on: 01/11/2018 11:47 AM   Modules accepted: Orders

## 2018-01-16 ENCOUNTER — Telehealth: Payer: Self-pay | Admitting: Emergency Medicine

## 2018-01-16 NOTE — Telephone Encounter (Signed)
  Reason for CRM: patient is calling and states she would like her ultrasound sent to her sports medicine doctor. [facility] She is seeing Dr. Fayrene FearingJames today at 2:45pm.  Fax [phone]    Ultrasound report faxed to Dr. Fayrene FearingJames @ [phone]

## 2018-01-17 ENCOUNTER — Ambulatory Visit (INDEPENDENT_AMBULATORY_CARE_PROVIDER_SITE_OTHER): Payer: Commercial Managed Care - PPO | Admitting: Physician Assistant

## 2018-01-17 ENCOUNTER — Other Ambulatory Visit: Payer: Self-pay

## 2018-01-17 ENCOUNTER — Encounter: Payer: Self-pay | Admitting: Physician Assistant

## 2018-01-17 VITALS — BP 90/60 | HR 85 | Temp 98.0°F | Resp 14 | Ht 66.0 in | Wt 170.0 lb

## 2018-01-17 DIAGNOSIS — I889 Nonspecific lymphadenitis, unspecified: Secondary | ICD-10-CM

## 2018-01-17 DIAGNOSIS — R590 Localized enlarged lymph nodes: Secondary | ICD-10-CM

## 2018-01-17 LAB — CBC WITH DIFFERENTIAL/PLATELET
Basophils Absolute: 0 10*3/uL (ref 0.0–0.1)
Basophils Relative: 0.5 % (ref 0.0–3.0)
EOS PCT: 0.5 % (ref 0.0–5.0)
Eosinophils Absolute: 0 10*3/uL (ref 0.0–0.7)
HEMATOCRIT: 38.8 % (ref 36.0–46.0)
HEMOGLOBIN: 13 g/dL (ref 12.0–15.0)
Lymphocytes Relative: 48.6 % — ABNORMAL HIGH (ref 12.0–46.0)
Lymphs Abs: 1.7 10*3/uL (ref 0.7–4.0)
MCHC: 33.7 g/dL (ref 30.0–36.0)
MCV: 89.5 fl (ref 78.0–100.0)
MONO ABS: 0.6 10*3/uL (ref 0.1–1.0)
Monocytes Relative: 18.4 % — ABNORMAL HIGH (ref 3.0–12.0)
Neutro Abs: 1.1 10*3/uL — ABNORMAL LOW (ref 1.4–7.7)
Neutrophils Relative %: 32 % — ABNORMAL LOW (ref 43.0–77.0)
Platelets: 271 10*3/uL (ref 150.0–400.0)
RBC: 4.33 Mil/uL (ref 3.87–5.11)
RDW: 12.5 % (ref 11.5–15.5)
WBC: 3.5 10*3/uL — AB (ref 4.0–10.5)

## 2018-01-17 MED ORDER — DOXYCYCLINE HYCLATE 100 MG PO CAPS: 100.0000 mg | ORAL_CAPSULE | Freq: Two times a day (BID) | ORAL | 0 refills | Status: DC

## 2018-01-17 NOTE — Progress Notes (Signed)
Patient presents to clinic today c/o continued R-sided posterior lymph node swelling with tenderness/pain. Patient was seen on 01/03/18 by PCP at which time she was diagnosed with lymphadenopathy. CBC at that time was negative for leukocytosis. Monospot was negative. US obtained at the time and was unremarkable. Patient was started on cyclobenzaprine and tramadol for pain. Instructed to follow-up if symptoms were not improving. Patient notes seeing a sports medicine provider since then due to pain in the neck from the swelling. Was again told she had lymphadenopathy and encouraged to follow-up with PCP. She has noted fevers in the evening and early morning with chills and occasional headache. Denies chest congestion, cough or other URI symptom. Denies ear pain or pressure. Denies recent travel. Has a son who has a cold currently.  Past Medical History:  Diagnosis Date  . BV (bacterial vaginosis)   . Febrile seizure (HCC)     Current Outpatient Medications on File Prior to Visit  Medication Sig Dispense Refill  . Boric Acid CRYS Use supp nightly x 5 nights then 2-3x/ week as needed 500 g 5  . etonogestrel (NEXPLANON) 68 MG IMPL implant Nexplanon 68 mg subdermal implant  Inject 1 implant by subcutaneous route.     No current facility-administered medications on file prior to visit.     No Known Allergies  Family History  Problem Relation Age of Onset  . Early death Mother   . Diabetes Father   . Hypertension Father   . Diabetes Maternal Grandmother   . Stroke Maternal Grandmother   . Hypertension Sister     Social History   Socioeconomic History  . Marital status: Single    Spouse name: Not on file  . Number of children: Not on file  . Years of education: Not on file  . Highest education level: Not on file  Occupational History  . Not on file  Social Needs  . Financial resource strain: Not on file  . Food insecurity:    Worry: Not on file    Inability: Not on file  .  Transportation needs:    Medical: Not on file    Non-medical: Not on file  Tobacco Use  . Smoking status: Never Smoker  . Smokeless tobacco: Never Used  Substance and Sexual Activity  . Alcohol use: Yes    Comment: liquor on weekends  . Drug use: No  . Sexual activity: Yes  Lifestyle  . Physical activity:    Days per week: Not on file    Minutes per session: Not on file  . Stress: Not on file  Relationships  . Social connections:    Talks on phone: Not on file    Gets together: Not on file    Attends religious service: Not on file    Active member of club or organization: Not on file    Attends meetings of clubs or organizations: Not on file    Relationship status: Not on file  Other Topics Concern  . Not on file  Social History Narrative  . Not on file   Review of Systems - See HPI.  All other ROS are negative.  BP 90/60   Pulse 85   Temp 98 F (36.7 C) (Oral)   Resp 14   Ht 5\' 6"  (1.676 m)   Wt 170 lb (77.1 kg)   SpO2 99%   BMI 27.44 kg/m   Physical Exam  Constitutional: She is oriented to person, place, and time. She appears well-developed  and well-nourished.  HENT:  Head: Normocephalic and atraumatic.  Right Ear: External ear normal.  Left Ear: External ear normal.  Nose: Nose normal.  Mouth/Throat: Oropharynx is clear and moist. No oropharyngeal exudate.  Eyes: Conjunctivae are normal.  Neck: Neck supple.  Cardiovascular: Normal rate, regular rhythm, normal heart sounds and intact distal pulses.  Pulmonary/Chest: Effort normal and breath sounds normal. No stridor. No respiratory distress. She has no wheezes. She has no rales. She exhibits no tenderness.  Abdominal: Soft. Bowel sounds are normal. She exhibits no distension. There is no tenderness.  Lymphadenopathy:       Head (right side): No submental, no submandibular, no tonsillar, no preauricular, no posterior auricular and no occipital adenopathy present.       Head (left side): No submental, no  submandibular, no tonsillar, no preauricular, no posterior auricular and no occipital adenopathy present.    She has cervical adenopathy.       Right cervical: Posterior cervical (2 cm mobile but warm and tender on examination) adenopathy present. No superficial cervical and no deep cervical adenopathy present.      Left cervical: No superficial cervical, no deep cervical and no posterior cervical adenopathy present.       Right axillary: No pectoral and no lateral adenopathy present.       Right: No supraclavicular adenopathy present.  Neurological: She is alert and oriented to person, place, and time.  Psychiatric: She has a normal mood and affect.  Vitals reviewed.   Recent Results (from the past 2160 hour(s))  Cervicovaginal ancillary only     Status: Abnormal   Collection Time: 11/30/17 12:00 AM  Result Value Ref Range   Bacterial vaginitis **POSITIVE for Gardnerella vaginalis** (A)     Comment: Normal Reference Range - Negative   Candida vaginitis **POSITIVE for Candida species** (A)     Comment: Normal Reference Range - Negative   Trichomonas Negative     Comment: Normal Reference Range - Negative  HIV antibody     Status: None   Collection Time: 11/30/17  9:46 AM  Result Value Ref Range   HIV 1&2 Ab, 4th Generation NON-REACTIVE NON-REACTI    Comment: HIV-1 antigen and HIV-1/HIV-2 antibodies were not detected. There is no laboratory evidence of HIV infection. Marland Kitchen PLEASE NOTE: This information has been disclosed to you from records whose confidentiality may be protected by state law.  If your state requires such protection, then the state law prohibits you from making any further disclosure of the information without the specific written consent of the person to whom it pertains, or as otherwise permitted by law. A general authorization for the release of medical or other information is NOT sufficient for this purpose. . For additional information please refer  to http://education.[employer].com/faq/FAQ106 (This link is being provided for informational/ educational purposes only.) . Marland Kitchen The performance of this assay has not been clinically validated in patients less than 56 years old. Marland Kitchen   CBC with Differential/Platelet     Status: Abnormal   Collection Time: 11/30/17  9:46 AM  Result Value Ref Range   WBC 2.9 Repeated and verified X2. (L) 4.0 - 10.5 K/uL   RBC 4.26 3.87 - 5.11 Mil/uL   Hemoglobin 13.0 12.0 - 15.0 g/dL   HCT 16.1 09.6 - 04.5 %   MCV 91.0 78.0 - 100.0 fl   MCHC 33.5 30.0 - 36.0 g/dL   RDW 40.9 81.1 - 91.4 %   Platelets 255.0 150.0 - 400.0 K/uL  Neutrophils Relative % 33.4 (L) 43.0 - 77.0 %   Lymphocytes Relative 50.7 (H) 12.0 - 46.0 %   Monocytes Relative 14.9 (H) 3.0 - 12.0 %   Eosinophils Relative 0.5 0.0 - 5.0 %   Basophils Relative 0.5 0.0 - 3.0 %   Neutro Abs 1.0 (L) 1.4 - 7.7 K/uL   Lymphs Abs 1.5 0.7 - 4.0 K/uL   Monocytes Absolute 0.4 0.1 - 1.0 K/uL   Eosinophils Absolute 0.0 0.0 - 0.7 K/uL   Basophils Absolute 0.0 0.0 - 0.1 K/uL  Comprehensive metabolic panel     Status: Abnormal   Collection Time: 11/30/17  9:46 AM  Result Value Ref Range   Sodium 136 135 - 145 mEq/L   Potassium 4.3 3.5 - 5.1 mEq/L   Chloride 106 96 - 112 mEq/L   CO2 22 19 - 32 mEq/L   Glucose, Bld 97 70 - 99 mg/dL   BUN 12 6 - 23 mg/dL   Creatinine, Ser 9.60 0.40 - 1.20 mg/dL   Total Bilirubin 0.2 0.2 - 1.2 mg/dL   Alkaline Phosphatase 33 (L) 39 - 117 U/L   AST 11 0 - 37 U/L   ALT 12 0 - 35 U/L   Total Protein 7.9 6.0 - 8.3 g/dL   Albumin 4.3 3.5 - 5.2 g/dL   Calcium 9.2 8.4 - 45.4 mg/dL   GFR 098.11 >91.47 mL/min  Lipid panel     Status: Abnormal   Collection Time: 11/30/17  9:46 AM  Result Value Ref Range   Cholesterol 166 0 - 200 mg/dL    Comment: ATP III Classification       Desirable:  < 200 mg/dL               Borderline High:  200 - 239 mg/dL          High:  > = 829 mg/dL   Triglycerides 56.2 0.0 - 149.0 mg/dL     Comment: Normal:  <130 mg/dLBorderline High:  150 - 199 mg/dL   HDL 86.57 >84.69 mg/dL   VLDL 9.8 0.0 - 62.9 mg/dL   LDL Cholesterol 528 (H) 0 - 99 mg/dL   Total CHOL/HDL Ratio 3     Comment:                Men          Women1/2 Average Risk     3.4          3.3Average Risk          5.0          4.42X Average Risk          9.6          7.13X Average Risk          15.0          11.0                       NonHDL 118.20     Comment: NOTE:  Non-HDL goal should be 30 mg/dL higher than patient's LDL goal (i.e. LDL goal of < 70 mg/dL, would have non-HDL goal of < 100 mg/dL)  POCT Mono (Epstein Barr Virus)     Status: Normal   Collection Time: 01/03/18 12:00 PM  Result Value Ref Range   Mono, POC Negative Negative  CBC with Differential/Platelet     Status: Abnormal   Collection Time: 01/03/18 12:05 PM  Result Value Ref Range  WBC 3.9 (L) 4.0 - 10.5 K/uL   RBC 4.24 3.87 - 5.11 Mil/uL   Hemoglobin 13.0 12.0 - 15.0 g/dL   HCT 16.1 09.6 - 04.5 %   MCV 90.3 78.0 - 100.0 fl   MCHC 34.1 30.0 - 36.0 g/dL   RDW 40.9 81.1 - 91.4 %   Platelets 236.0 150.0 - 400.0 K/uL   Neutrophils Relative % 27.4 (L) 43.0 - 77.0 %   Lymphocytes Relative 50.5 (H) 12.0 - 46.0 %   Monocytes Relative 21.2 (H) 3.0 - 12.0 %    Comment: A Manual Differential was performed and is consistent with the Automated Differential.   Eosinophils Relative 0.5 0.0 - 5.0 %   Basophils Relative 0.4 0.0 - 3.0 %   Neutro Abs 1.1 (L) 1.4 - 7.7 K/uL   Lymphs Abs 2.0 0.7 - 4.0 K/uL   Monocytes Absolute 0.8 0.1 - 1.0 K/uL   Eosinophils Absolute 0.0 0.0 - 0.7 K/uL   Basophils Absolute 0.0 0.0 - 0.1 K/uL    Assessment/Plan: 1. Lymphadenopathy, posterior cervical Continued. Suspect viral etiology but now with concern for lymphadenitis. Will repeat CBC today along with full EBV panel and CMV panel.   - CBC w/Diff - CMV abs, IgG+IgM (cytomegalovirus) - Epstein-Barr virus VCA antibody panel  2. Lymphadenitis Start doxycycline today.  Labs placed. Supportive measures and pain relief reviewed. Will discuss follow-up when I call her with results.   - CBC w/Diff - doxycycline (VIBRAMYCIN) 100 MG capsule; Take 1 capsule (100 mg total) by mouth 2 (two) times daily.  Dispense: 14 capsule; Refill: 0   Piedad Climes, PA-C

## 2018-01-17 NOTE — Patient Instructions (Signed)
Please go to the lab today for blood work.  I will call you with your results. We will alter treatment regimen(s) if indicated by your results.   Please start the antibiotic as directed. Avoid excess sunlight while on this medication.  Take with food.  Continue the Flexeril if it helps with muscle tension. I would recommend alternating tylenol and Ibuprofen to help with pain and inflammation.

## 2018-01-18 ENCOUNTER — Emergency Department (HOSPITAL_BASED_OUTPATIENT_CLINIC_OR_DEPARTMENT_OTHER)
Admission: EM | Admit: 2018-01-18 | Discharge: 2018-01-18 | Disposition: A | Discharge status: Home / Self Care | Payer: Commercial Managed Care - PPO | Attending: Emergency Medicine | Admitting: Emergency Medicine

## 2018-01-18 ENCOUNTER — Emergency Department (HOSPITAL_BASED_OUTPATIENT_CLINIC_OR_DEPARTMENT_OTHER): Payer: Commercial Managed Care - PPO

## 2018-01-18 ENCOUNTER — Encounter (HOSPITAL_BASED_OUTPATIENT_CLINIC_OR_DEPARTMENT_OTHER): Payer: Self-pay | Admitting: Emergency Medicine

## 2018-01-18 ENCOUNTER — Other Ambulatory Visit: Payer: Self-pay

## 2018-01-18 ENCOUNTER — Ambulatory Visit: Payer: Self-pay | Admitting: *Deleted

## 2018-01-18 DIAGNOSIS — R809 Proteinuria, unspecified: Secondary | ICD-10-CM | POA: Diagnosis not present

## 2018-01-18 DIAGNOSIS — Y939 Activity, unspecified: Secondary | ICD-10-CM | POA: Insufficient documentation

## 2018-01-18 DIAGNOSIS — S3992XA Unspecified injury of lower back, initial encounter: Secondary | ICD-10-CM | POA: Diagnosis present

## 2018-01-18 DIAGNOSIS — X58XXXA Exposure to other specified factors, initial encounter: Secondary | ICD-10-CM | POA: Insufficient documentation

## 2018-01-18 DIAGNOSIS — Y999 Unspecified external cause status: Secondary | ICD-10-CM | POA: Insufficient documentation

## 2018-01-18 DIAGNOSIS — Y929 Unspecified place or not applicable: Secondary | ICD-10-CM | POA: Diagnosis not present

## 2018-01-18 DIAGNOSIS — S39012A Strain of muscle, fascia and tendon of lower back, initial encounter: Secondary | ICD-10-CM | POA: Insufficient documentation

## 2018-01-18 DIAGNOSIS — Z79899 Other long term (current) drug therapy: Secondary | ICD-10-CM | POA: Diagnosis not present

## 2018-01-18 LAB — URINALYSIS, MICROSCOPIC (REFLEX)

## 2018-01-18 LAB — URINALYSIS, ROUTINE W REFLEX MICROSCOPIC
Glucose, UA: NEGATIVE mg/dL
Hgb urine dipstick: NEGATIVE
Ketones, ur: 15 mg/dL — AB
LEUKOCYTES UA: NEGATIVE
Nitrite: NEGATIVE
PROTEIN: 30 mg/dL — AB
Specific Gravity, Urine: 1.015 (ref 1.005–1.030)
pH: 8 (ref 5.0–8.0)

## 2018-01-18 LAB — PREGNANCY, URINE: PREG TEST UR: NEGATIVE

## 2018-01-18 MED ORDER — KETOROLAC TROMETHAMINE 60 MG/2ML IM SOLN
60.0000 mg | Freq: Once | INTRAMUSCULAR | Status: AC
  Administered 2018-01-18: 60 mg via INTRAMUSCULAR
  Filled 2018-01-18: qty 2

## 2018-01-18 MED ORDER — MELOXICAM 15 MG PO TABS: 15.0000 mg | ORAL_TABLET | Freq: Every day | ORAL | 0 refills | Status: DC

## 2018-01-18 MED FILL — MELOXICAM 15 MG TABLET: 15 | 10 days supply | Qty: 10 | Fill #0

## 2018-01-18 NOTE — ED Triage Notes (Addendum)
Low back pain that started yesterday. Denies injury. She was seen by her PCP yesterday ref neck pain for over a month. She had blood drawn but no results yet. Pt states her lymph nodes were swollen so he started her on abx. She vomited after taking them which initiated her back pain. Also reports numbness and tingling to both hands.

## 2018-01-18 NOTE — ED Notes (Signed)
Pt upset about wait time. PA is currently discussing plan of care with MD. Pt made aware.

## 2018-01-18 NOTE — ED Notes (Signed)
Patient transported to X-ray 

## 2018-01-18 NOTE — ED Provider Notes (Signed)
[facility] EMERGENCY DEPARTMENT Provider Note   CSN: [id number] Arrival date & time: 01/18/18  1013     History   Chief Complaint Chief Complaint  Patient presents with  . Back Pain    HPI Joanne Santos is a 27 y.o. female  Who presents tiwht c/o Low back pain. Patient is currently being worked up for Lymphadenitis. She was given ABX and had an episode of vomiting after she took the medications which started her back pain. She states that when she vomited she had onset of Low back pain after retching and her back locked up. She states that she had difficulty moving or standing up. She took a flexeril which helped her to stand up and came to the ER for evaluation. Pain is worse with movement, sanding, twisting. Bending or flexing her hips. She denies fever, chills, nausea, abdominal pain or urinary sxs.  HPI  Past Medical History:  Diagnosis Date  . BV (bacterial vaginosis)   . Febrile seizure [facility])     Patient Active Problem List   Diagnosis Date Noted  . Presence of subdermal contraceptive  - nexplanon 2017 11/30/2017  . Genital herpes 10/07/2016  . Bacterial vaginosis 09/19/2016    Past Surgical History:  Procedure Laterality Date  . CESAREAN SECTION  2016     OB History   None      Home Medications    Prior to Admission medications   Medication Sig Start Date End Date Taking? Authorizing Provider  Boric Acid CRYS Use supp nightly x 5 nights then 2-3x/ week as needed 12/27/17   Willow Ora, MD  doxycycline (VIBRAMYCIN) 100 MG capsule Take 1 capsule (100 mg total) by mouth 2 (two) times daily. 01/17/18   Waldon Merl, PA-C  etonogestrel (NEXPLANON) 68 MG IMPL implant Nexplanon 68 mg subdermal implant  Inject 1 implant by subcutaneous route.    [provider]    Family History Family History  Problem Relation Age of Onset  . Early death Mother   . Diabetes Father   . Hypertension Father   . Diabetes Maternal Grandmother   .  Stroke Maternal Grandmother   . Hypertension Sister     Social History Social History   Tobacco Use  . Smoking status: Never Smoker  . Smokeless tobacco: Never Used  Substance Use Topics  . Alcohol use: Yes    Comment: liquor on weekends  . Drug use: No     Allergies   Patient has no known allergies.   Review of Systems Review of Systems  Ten systems reviewed and are negative for acute change, except as noted in the HPI.   Physical Exam Updated Vital Signs BP 103/66 (BP Location: Right Arm)   Pulse 73   Temp 98.1 F (36.7 C) (Oral)   Resp 16   Ht 5\' 6"  (1.676 m)   Wt 77.1 kg (170 lb)   SpO2 100%   BMI 27.44 kg/m   Physical Exam  Constitutional: She is oriented to person, place, and time. She appears well-developed and well-nourished. No distress.  HENT:  Head: Normocephalic and atraumatic.  Eyes: Conjunctivae are normal. No scleral icterus.  Neck: Normal range of motion.  Cardiovascular: Normal rate, regular rhythm and normal heart sounds. Exam reveals no gallop and no friction rub.  No murmur heard. Pulmonary/Chest: Effort normal and breath sounds normal. No respiratory distress.  Abdominal: Soft. Bowel sounds are normal. She exhibits no distension and no mass. There is no tenderness.  There is no guarding.  Musculoskeletal:       Lumbar back: She exhibits tenderness (muscular), pain and spasm. She exhibits normal range of motion, no bony tenderness, no swelling and no edema.       Back:  Neurological: She is alert and oriented to person, place, and time.  Skin: Skin is warm and dry. She is not diaphoretic.  Psychiatric: Her behavior is normal.  Nursing note and vitals reviewed.    ED Treatments / Results  Labs (all labs ordered are listed, but only abnormal results are displayed) Labs Reviewed  URINALYSIS, ROUTINE W REFLEX MICROSCOPIC  PREGNANCY, URINE    EKG None  Radiology No results found.  Procedures Procedures (including critical care  time)  Medications Ordered in ED Medications  ketorolac (TORADOL) injection 60 mg (has no administration in time range)     Initial Impression / Assessment and Plan / ED Course  I have reviewed the triage vital signs and the nursing notes.  Pertinent labs & imaging results that were available during my care of the patient were reviewed by me and considered in my medical decision making (see chart for details).     She had imaging negative, urine shows some proteinuria which I have advised patient to follow-up for repeat urinalysis.  Given Toradol with improvement in pain here. Patient with back pain.  No neurological deficits and normal neuro exam.  Patient can walk but states is painful.  No loss of bowel or bladder control.  No concern for cauda equina.  No fever, night sweats, weight loss, h/o cancer, IVDU.  RICE protocol and pain medicine indicated and discussed with patient.    Final Clinical Impressions(s) / ED Diagnoses   Final diagnoses:  None    ED Discharge Orders    None       Arthor CaptainHarris, Charlita Brian, PA-C 01/18/18 1847    Little, Ambrose Finlandachel Morgan, MD 01/19/18 [phone]

## 2018-01-18 NOTE — Telephone Encounter (Signed)
Pt called with having severe back pain. She stated her whole back. She is unable to move right now. She took the doxycycline and started vomiting and then felt a sharp pain in her back and was not able to move at that point. She had taken Tylenol and a muscle relaxer but that has not helped.  Upon waking she has dizziness and lightheadedness. She is crying right now. Have advised her to call 911 to assess her and take her to the emergency department. Pt voiced understanding. [facility]   Reason for Disposition . Unable to walk  Answer Assessment - Initial Assessment Questions 1. ONSET: "When did the pain begin?"      Severe pain this morning 2. LOCATION: "Where does it hurt?" (upper, mid or lower back)     Whole back 3. SEVERITY: "How bad is the pain?"  (e.g., Scale 1-10; mild, moderate, or severe)   - MILD (1-3): doesn't interfere with normal activities    - MODERATE (4-7): interferes with normal activities or awakens from sleep    - SEVERE (8-10): excruciating pain, unable to do any normal activities      Pain #10 4. PATTERN: "Is the pain constant?" (e.g., yes, no; constant, intermittent)      constant 5. RADIATION: "Does the pain shoot into your legs or elsewhere?"     no 6. CAUSE:  "What do you think is causing the back pain?"      dont know 7. BACK OVERUSE:  "Any recent lifting of heavy objects, strenuous work or exercise?"     no 8. MEDICATIONS: "What have you taken so far for the pain?" (e.g., nothing, acetaminophen, NSAIDS)     Tylenol and a muscle relaxer 9. NEUROLOGIC SYMPTOMS: "Do you have any weakness, numbness, or problems with bowel/bladder control?"     Numbness and tingling in hands 10. OTHER SYMPTOMS: "Do you have any other symptoms?" (e.g., fever, abdominal pain, burning with urination, blood in urine)       no 11. PREGNANCY: "Is there any chance you are pregnant?" (e.g., yes, no; LMP)       no  Protocols used: BACK PAIN-A-AH

## 2018-01-18 NOTE — Telephone Encounter (Signed)
Will keep an eye out on patient chart.

## 2018-01-18 NOTE — Discharge Instructions (Signed)
Follow up with your pcp about your ongoing lymphadenitis. You had a small amount of protein in your urine, this may be transient but should be rechecked by her primary care physician. SEEK IMMEDIATE MEDICAL ATTENTION IF: New numbness, tingling, weakness, or problem with the use of your arms or legs.  Severe back pain not relieved with medications.  Change in bowel or bladder control.  Increasing pain in any areas of the body (such as chest or abdominal pain).  Shortness of breath, dizziness or fainting.  Nausea (feeling sick to your stomach), vomiting, fever, or sweats.

## 2018-01-19 LAB — EPSTEIN-BARR VIRUS VCA ANTIBODY PANEL
EBV NA IgG: >600 U/mL — ABNORMAL HIGH
EBV VCA IgG: 334 U/mL — ABNORMAL HIGH

## 2018-01-19 LAB — CMV ABS, IGG+IGM (CYTOMEGALOVIRUS): CMV IgM: <30 AU/mL

## 2018-02-22 ENCOUNTER — Other Ambulatory Visit: Payer: Self-pay

## 2018-02-22 ENCOUNTER — Encounter (HOSPITAL_BASED_OUTPATIENT_CLINIC_OR_DEPARTMENT_OTHER): Payer: Self-pay | Admitting: Emergency Medicine

## 2018-02-22 ENCOUNTER — Emergency Department (HOSPITAL_BASED_OUTPATIENT_CLINIC_OR_DEPARTMENT_OTHER)
Admission: EM | Admit: 2018-02-22 | Discharge: 2018-02-22 | Disposition: A | Discharge status: Home / Self Care | Payer: Commercial Managed Care - PPO | Attending: Emergency Medicine | Admitting: Emergency Medicine

## 2018-02-22 DIAGNOSIS — N898 Other specified noninflammatory disorders of vagina: Secondary | ICD-10-CM

## 2018-02-22 LAB — URINALYSIS, ROUTINE W REFLEX MICROSCOPIC
Bilirubin Urine: NEGATIVE
Glucose, UA: NEGATIVE mg/dL
HGB URINE DIPSTICK: NEGATIVE
Ketones, ur: NEGATIVE mg/dL
LEUKOCYTES UA: NEGATIVE
NITRITE: NEGATIVE
PROTEIN: NEGATIVE mg/dL
Specific Gravity, Urine: 1.025 (ref 1.005–1.030)
pH: 6.5 (ref 5.0–8.0)

## 2018-02-22 LAB — WET PREP, GENITAL
CLUE CELLS WET PREP: NONE SEEN
Sperm: NONE SEEN
TRICH WET PREP: NONE SEEN
Yeast Wet Prep HPF POC: NONE SEEN

## 2018-02-22 LAB — PREGNANCY, URINE: Preg Test, Ur: NEGATIVE

## 2018-02-22 MED ORDER — AZITHROMYCIN 250 MG PO TABS
1000.0000 mg | ORAL_TABLET | Freq: Once | ORAL | Status: AC
  Administered 2018-02-22: 1000 mg via ORAL
  Filled 2018-02-22: qty 4

## 2018-02-22 MED ORDER — LIDOCAINE HCL (PF) 1 % IJ SOLN
INTRAMUSCULAR | Status: AC
  Administered 2018-02-22: 1.3 mL
  Filled 2018-02-22: qty 5

## 2018-02-22 MED ORDER — CEFTRIAXONE SODIUM 250 MG IJ SOLR
250.0000 mg | Freq: Once | INTRAMUSCULAR | Status: AC
  Administered 2018-02-22: 250 mg via INTRAMUSCULAR
  Filled 2018-02-22: qty 250

## 2018-02-22 NOTE — ED Triage Notes (Signed)
White vaginal discharge with itching and irritation since earlier this week. Has recently been on abx.

## 2018-02-22 NOTE — ED Provider Notes (Addendum)
[facility] EMERGENCY DEPARTMENT Provider Note   CSN: [id number] Arrival date & time: 02/22/18  0800     History   Chief Complaint Chief Complaint  Patient presents with  . Vaginal Discharge    HPI Joanne Santos is a 27 y.o. female.  Patient is a 27 year old female with a history of prior genital herpes and bacterial vaginosis who presents with vaginal discharge.  She has a 3 to 4-day history of white vaginal discharge and vaginal itching.  She has no bleeding.  Her last menstrual period was earlier this month.  She has no abdominal pain.  No nausea or vomiting.  No fevers.  She does have unprotected sex.     Past Medical History:  Diagnosis Date  . BV (bacterial vaginosis)   . Febrile seizure [facility])     Patient Active Problem List   Diagnosis Date Noted  . Presence of subdermal contraceptive  - nexplanon 2017 11/30/2017  . Genital herpes 10/07/2016  . Bacterial vaginosis 09/19/2016    Past Surgical History:  Procedure Laterality Date  . CESAREAN SECTION  2016     OB History   None      Home Medications    Prior to Admission medications   Medication Sig Start Date End Date Taking? Authorizing Provider  Boric Acid CRYS Use supp nightly x 5 nights then 2-3x/ week as needed 12/27/17   Willow Ora, MD  etonogestrel (NEXPLANON) 68 MG IMPL implant Nexplanon 68 mg subdermal implant  Inject 1 implant by subcutaneous route.    [provider]    Family History Family History  Problem Relation Age of Onset  . Early death Mother   . Diabetes Father   . Hypertension Father   . Diabetes Maternal Grandmother   . Stroke Maternal Grandmother   . Hypertension Sister     Social History Social History   Tobacco Use  . Smoking status: Never Smoker  . Smokeless tobacco: Never Used  Substance Use Topics  . Alcohol use: Yes    Comment: liquor on weekends  . Drug use: No     Allergies   Patient has no known allergies.   Review of  Systems Review of Systems  Constitutional: Negative for chills, diaphoresis, fatigue and fever.  HENT: Negative for congestion, rhinorrhea and sneezing.   Eyes: Negative.   Respiratory: Negative for cough, chest tightness and shortness of breath.   Cardiovascular: Negative for chest pain and leg swelling.  Gastrointestinal: Negative for abdominal pain, blood in stool, diarrhea, nausea and vomiting.  Genitourinary: Positive for vaginal discharge. Negative for difficulty urinating, flank pain, frequency and hematuria.  Musculoskeletal: Negative for arthralgias and back pain.  Skin: Negative for rash.  Neurological: Negative for dizziness, speech difficulty, weakness, numbness and headaches.     Physical Exam Updated Vital Signs BP 109/68 (BP Location: Right Arm)   Pulse 68   Temp 98.5 F (36.9 C) (Oral)   Resp 16   Ht 5\' 5"  (1.651 m)   Wt 74.8 kg   LMP 02/07/2018   SpO2 100%   BMI 27.46 kg/m   Physical Exam  Constitutional: She is oriented to person, place, and time. She appears well-developed and well-nourished.  HENT:  Head: Normocephalic and atraumatic.  Eyes: Pupils are equal, round, and reactive to light.  Neck: Normal range of motion. Neck supple.  Cardiovascular: Normal rate, regular rhythm and normal heart sounds.  Pulmonary/Chest: Effort normal and breath sounds normal. No respiratory distress. She has no  wheezes. She has no rales. She exhibits no tenderness.  Abdominal: Soft. Bowel sounds are normal. There is no tenderness. There is no rebound and no guarding.  Genitourinary:  Genitourinary Comments: Patient has some thick white vaginal discharge, no cervical motion tenderness, no adnexal tenderness  Musculoskeletal: Normal range of motion. She exhibits no edema.  Lymphadenopathy:    She has no cervical adenopathy.  Neurological: She is alert and oriented to person, place, and time.  Skin: Skin is warm and dry. No rash noted.  Psychiatric: She has a normal mood  and affect.     ED Treatments / Results  Labs (all labs ordered are listed, but only abnormal results are displayed) Labs Reviewed  WET PREP, GENITAL - Abnormal; Notable for the following components:      Result Value   WBC, Wet Prep HPF POC MANY (*)    All other components within normal limits  URINALYSIS, ROUTINE W REFLEX MICROSCOPIC  PREGNANCY, URINE  RPR  HIV ANTIBODY (ROUTINE TESTING)  GC/CHLAMYDIA PROBE AMP ([place]) NOT [facility]    EKG None  Radiology No results found.  Procedures Procedures (including critical care time)  Medications Ordered in ED Medications - No data to display   Initial Impression / Assessment and Plan / ED Course  I have reviewed the triage vital signs and the nursing notes.  Pertinent labs & imaging results that were available during my care of the patient were reviewed by me and considered in my medical decision making (see chart for details).     Patient's wet prep is negative other than WBCs.  Her pregnancy test is negative.  Her urinalysis is clear without evidence of infection.  She does not want to be presumptively treated with Rocephin and Zithromax.  She will follow-up with her PCP if her symptoms are not improving.  STD testing is pending.  Pt changed her mind and did get rocephin and zithromax. Final Clinical Impressions(s) / ED Diagnoses   Final diagnoses:  Vaginal discharge    ED Discharge Orders    None       Rolan BuccoBelfi, Anber Mckiver, MD 02/22/18 [id number]    Rolan BuccoBelfi, Aliviah Spain, MD 02/22/18 [phone]

## 2018-02-23 LAB — GC/CHLAMYDIA PROBE AMP (~~LOC~~) NOT AT ARMC
CHLAMYDIA, DNA PROBE: NEGATIVE
NEISSERIA GONORRHEA: NEGATIVE

## 2018-02-23 LAB — HIV ANTIBODY (ROUTINE TESTING W REFLEX): HIV Screen 4th Generation wRfx: NONREACTIVE

## 2018-02-23 LAB — RPR: RPR: NONREACTIVE

## 2018-06-11 ENCOUNTER — Emergency Department (HOSPITAL_BASED_OUTPATIENT_CLINIC_OR_DEPARTMENT_OTHER)
Admission: EM | Admit: 2018-06-11 | Discharge: 2018-06-11 | Disposition: A | Discharge status: Home / Self Care | Payer: BLUE CROSS/BLUE SHIELD | Attending: Emergency Medicine | Admitting: Emergency Medicine

## 2018-06-11 ENCOUNTER — Other Ambulatory Visit: Payer: Self-pay

## 2018-06-11 ENCOUNTER — Encounter (HOSPITAL_BASED_OUTPATIENT_CLINIC_OR_DEPARTMENT_OTHER): Payer: Self-pay

## 2018-06-11 DIAGNOSIS — N898 Other specified noninflammatory disorders of vagina: Secondary | ICD-10-CM | POA: Diagnosis not present

## 2018-06-11 DIAGNOSIS — Z5321 Procedure and treatment not carried out due to patient leaving prior to being seen by health care provider: Secondary | ICD-10-CM | POA: Diagnosis not present

## 2018-06-11 LAB — URINALYSIS, ROUTINE W REFLEX MICROSCOPIC
Bilirubin Urine: NEGATIVE
GLUCOSE, UA: NEGATIVE mg/dL
Hgb urine dipstick: NEGATIVE
Ketones, ur: NEGATIVE mg/dL
LEUKOCYTES UA: NEGATIVE
Nitrite: NEGATIVE
PROTEIN: NEGATIVE mg/dL
SPECIFIC GRAVITY, URINE: 1.02 (ref 1.005–1.030)
pH: 8 (ref 5.0–8.0)

## 2018-06-11 LAB — PREGNANCY, URINE: PREG TEST UR: NEGATIVE

## 2018-06-11 NOTE — ED Triage Notes (Signed)
Pt c/o vaginal d/c-started after taking abx-states she took rx meds for yeast infection with no improvement-NAD-steady gait

## 2018-06-11 NOTE — ED Notes (Signed)
No answer

## 2018-06-21 ENCOUNTER — Telehealth: Payer: Self-pay

## 2018-06-21 NOTE — Telephone Encounter (Signed)
Copied from CRM [id number]. Topic: Appointment Scheduling - Scheduling Inquiry for Clinic >> Jun 21, 2018  9:54 AM Baldo DaubAlexander, Amber L wrote: Reason for CRM:   Pt states that Nexplanon needs to be removed, she wants to remain on birth control.  Pt states it was put on on 07/22/2015.  Pt also wants to let office know that she has new [employer] insurance - member ID #: [id number]). Pt can be reached at [phone]

## 2018-07-02 NOTE — Telephone Encounter (Signed)
Pt checking status on hearing back about birth control.

## 2018-07-02 NOTE — Telephone Encounter (Signed)
Office visit to discuss birth control and get set up for nexplanon removal. Please schedule.

## 2018-07-09 ENCOUNTER — Ambulatory Visit: Payer: BLUE CROSS/BLUE SHIELD | Admitting: Family Medicine

## 2018-07-13 ENCOUNTER — Other Ambulatory Visit: Payer: Self-pay

## 2018-07-13 ENCOUNTER — Ambulatory Visit: Payer: BLUE CROSS/BLUE SHIELD | Admitting: Family Medicine

## 2018-07-13 ENCOUNTER — Other Ambulatory Visit (HOSPITAL_COMMUNITY)
Admission: RE | Admit: 2018-07-13 | Discharge: 2018-07-13 | Disposition: A | Discharge status: Home / Self Care | Payer: BLUE CROSS/BLUE SHIELD | Source: Ambulatory Visit | Attending: Family Medicine | Admitting: Family Medicine

## 2018-07-13 ENCOUNTER — Encounter: Payer: Self-pay | Admitting: Family Medicine

## 2018-07-13 VITALS — BP 98/66 | HR 76 | Temp 98.4°F | Resp 16 | Ht 66.0 in | Wt 181.8 lb

## 2018-07-13 DIAGNOSIS — Z975 Presence of (intrauterine) contraceptive device: Secondary | ICD-10-CM

## 2018-07-13 DIAGNOSIS — N760 Acute vaginitis: Secondary | ICD-10-CM

## 2018-07-13 DIAGNOSIS — B9689 Other specified bacterial agents as the cause of diseases classified elsewhere: Secondary | ICD-10-CM | POA: Insufficient documentation

## 2018-07-13 DIAGNOSIS — Z3009 Encounter for other general counseling and advice on contraception: Secondary | ICD-10-CM

## 2018-07-13 MED ORDER — METRONIDAZOLE 500 MG PO TABS: 500.0000 mg | ORAL_TABLET | Freq: Two times a day (BID) | ORAL | 0 refills | Status: AC

## 2018-07-13 NOTE — Patient Instructions (Addendum)
Please return for nexplanon removal and reinsertion.  We have held a spot for you on Jan 21st.   We will call you to confirm that we have the insurance approval for the nexplanon and that we have the new one for you at that time.   Start taking calcium 600mg  twice a day and Vit D 1000 units daily to protect your bones.   Take the medication for BV. I will call you if anything comes back from your testing.   If you have any questions or concerns, please don't hesitate to send me a message via MyChart or call the office at [phone]. Thank you for visiting with [place] today! It's our pleasure caring for you.

## 2018-07-13 NOTE — Progress Notes (Signed)
Subjective  CC:  Chief Complaint  Patient presents with  . Contraception    Wants to get Nexplanon removed and a new one inserted    HPI: Joanne Santos is a 29 y.o. female who presents to the office today to address the problems listed above in the chief complaint.  28 yo G1P1001 with second nexplanon placed 3 years ago, due out later this month. Would like another for birth control. Has had 2 nexplanons and they work well. Recent STD testing negative. Pap due at next cpe in may.   bv sxs; has h/o recurrent bv. No pelvic pain or itching.   Assessment  1. Presence of subdermal contraceptive  - nexplanon 2017   2. Bacterial vaginosis   3. Birth control counseling      Plan   nexplanon for contraception:   Will schedule removal and reinsertion. Understands risks and benefits. Discussed long term progesterone only methods may increase thinning of bones. rec ca and vit D supplementation .  Treat empirically for bv and check swab for bv, yeast and trich.   Follow up: Return for as scheduled for nexplanon removal and insertion.  Visit date not found  No orders of the defined types were placed in this encounter.  Meds ordered this encounter  Medications  . metroNIDAZOLE (FLAGYL) 500 MG tablet    Sig: Take 1 tablet (500 mg total) by mouth 2 (two) times daily for 7 days.    Dispense:  14 tablet    Refill:  0      I reviewed the patients updated PMH, FH, and SocHx.    Patient Active Problem List   Diagnosis Date Noted  . Presence of subdermal contraceptive  - nexplanon 2017 11/30/2017  . Genital herpes 10/07/2016  . Bacterial vaginosis 09/19/2016   Current Meds  Medication Sig  . etonogestrel (NEXPLANON) 68 MG IMPL implant Nexplanon 68 mg subdermal implant  Inject 1 implant by subcutaneous route.    Allergies: Patient has No Known Allergies. Family History: Patient family history includes Diabetes in her father and maternal grandmother; Early death in her mother;  Hypertension in her father and sister; Stroke in her maternal grandmother. Social History:  Patient  reports that she has never smoked. She has never used smokeless tobacco. She reports current alcohol use. She reports that she does not use drugs.  Review of Systems: Constitutional: Negative for fever malaise or anorexia Cardiovascular: negative for chest pain Respiratory: negative for SOB or persistent cough Gastrointestinal: negative for abdominal pain  Objective  Vitals: BP 98/66   Pulse 76   Temp 98.4 F (36.9 C) (Oral)   Resp 16   Ht 5\' 6"  (1.676 m)   Wt 181 lb 12.8 oz (82.5 kg)   SpO2 99%   BMI 29.34 kg/m  General: no acute distress , A&Ox3 Left upper inner arm with easily palpable nexplonon implant.      Commons side effects, risks, benefits, and alternatives for medications and treatment plan prescribed today were discussed, and the patient expressed understanding of the given instructions. Patient is instructed to call or message via MyChart if he/she has any questions or concerns regarding our treatment plan. No barriers to understanding were identified. We discussed Red Flag symptoms and signs in detail. Patient expressed understanding regarding what to do in case of urgent or emergency type symptoms.   Medication list was reconciled, printed and provided to the patient in AVS. Patient instructions and summary information was reviewed with the patient as  documented in the AVS. This note was prepared with assistance of Dragon voice recognition software. Occasional wrong-word or sound-a-like substitutions may have occurred due to the inherent limitations of voice recognition software

## 2018-07-16 LAB — CERVICOVAGINAL ANCILLARY ONLY: Wet Prep (BD Affirm): POSITIVE — AB

## 2018-07-17 MED ORDER — FLUCONAZOLE 150 MG PO TABS: ORAL_TABLET | ORAL | 0 refills | Status: DC

## 2018-07-17 NOTE — Addendum Note (Signed)
Addended by: Asencion Partridge on: 07/17/2018 08:29 AM   Modules accepted: Orders

## 2018-07-18 ENCOUNTER — Encounter: Payer: Self-pay | Admitting: *Deleted

## 2018-07-23 ENCOUNTER — Encounter: Payer: Self-pay | Admitting: *Deleted

## 2018-07-24 ENCOUNTER — Ambulatory Visit: Payer: BLUE CROSS/BLUE SHIELD | Admitting: Family Medicine

## 2018-07-26 ENCOUNTER — Encounter: Payer: Self-pay | Admitting: *Deleted

## 2018-08-13 ENCOUNTER — Encounter: Payer: Self-pay | Admitting: Family Medicine

## 2018-08-13 ENCOUNTER — Other Ambulatory Visit: Payer: Self-pay

## 2018-08-13 ENCOUNTER — Ambulatory Visit: Payer: BLUE CROSS/BLUE SHIELD | Admitting: Family Medicine

## 2018-08-13 VITALS — BP 110/68 | HR 70 | Temp 98.6°F | Resp 16 | Ht 66.0 in | Wt 180.6 lb

## 2018-08-13 DIAGNOSIS — Z3046 Encounter for surveillance of implantable subdermal contraceptive: Secondary | ICD-10-CM

## 2018-08-13 DIAGNOSIS — Z30017 Encounter for initial prescription of implantable subdermal contraceptive: Secondary | ICD-10-CM

## 2018-08-13 MED ORDER — ETONOGESTREL 68 MG ~~LOC~~ IMPL: 68.0000 mg | DRUG_IMPLANT | Freq: Once | SUBCUTANEOUS | Status: DC

## 2018-08-13 NOTE — Progress Notes (Signed)
Nexplanon 68Mg  inserted L arm. SN: [id number] Exp: 12/26/2020 Lot# [id number] NDC: 3704-8889-16

## 2018-08-13 NOTE — Progress Notes (Signed)
Chief Complaint  Patient presents with  . Contraception    Getting Nexplanon removed and replaced     Nexplanon Removal Procedure Note:  Indication for Removal: Abnormal bleeding / Desires pregnancy / Due for Removal / Side effects / Other Location / Side:left Pre-Procedure Diagnosis: nexplanon removal Post-Procedure Diagnosis: Nexplanon removed Informed consent: Procedure, alternate treatment options, risks, and benefits were thoroughly explained to the patient and informed consent was obtained before the procedure started. Risks of the procedure include (but are not limited to) bleeding, infection, difficulty with removal, scarring, and nerve damage. There may be bruising at the site of incision and down the arm. Equipment for procedure available.  The appropriate universal time-out was taken. The procedure was confirmed by patient and team. The position correct for the procedure.  The patient is placed in the supine position. Aseptic conditions aree maintained. The rod is located by palpation. The area is cleaned with antiseptic. 1-2cc of 1% lidocaine with epinephrine is injected just underneath the end of the implant closest to the elbow. After firmly pressing down on the end of the implant closer to the axilla a 2-3 mm incision is made with a scalpel. The rod is pushed to the incision site and grasped with a mosquito forceps and gently removed. Blunt dissection was not needed. The patient tolerated the procedure well. The 4 cm rod was removed in its entirety. The incision was dressed with a small adhesive bandage closure and a pressure dressing was applied.  An alternate plan for contraception was discussed. The patient would like to use *Nexplanon for her contraception. She was given a detailed instruction sheet about her desired method of contraception.  Nexplanon Insertion Procedure Note  PRE-OP DIAGNOSIS: Patient desires long-term, reversible contraception. POST-OP DIAGNOSIS:  Same PROCEDURE: Nexplanon placement  PROCEDURE: -Written and verbal informed consent obtained, risks discussed included: bleeding, irregular menses, infection, pain/discomfort, cost for removal. -The appropriate universal timeout was taken and the patient's non-dominant hand was identified. -Patient was instructed to lie supine on the examination table with her non-dominant arm flexed at the elbow and externally rotated so that her hand was underneath her head (or as close as possible). -The insertion site was identified on the inner side of the non-dominant upper arm, 8 -10 cm from medial epicondyle of the humerus and 3-5 cm posterior to the sulcus (groove) between the biceps and triceps muscles. The insertion site was marked with a sterile marker. -A second spot (guiding mark) was made with the sterile marker 5 cm proximal (toward the shoulder) to the mark of the insertion site to serve as a direction guide during the Nexplanon insertion. -The insertion site was confirmed as the correct location on the inner side of the arm. -The skin was cleaned from the insertion site to the guiding mark with an antiseptic solution (Betadine / Chloraprep) and draped in a sterile fashion. -Anesthesia was achieved by injecting 2 mL of 1% lidocaine (without epinephrine) just under the skin along the planned insertion tunnel. Anesthesia confirmed. -The skin was punctured with the tip of the Nexplanon trocar needle slightly angled less than 30. The needle was inserted until the bevel (slanted opening of the tip) was just under the skin (and no further). -The applicator was then lowered to a horizontal position. While lifting the skin with the tip of the needle, the needle was inserted to its full length. Mild resistance was felt but no excessive force was used. The needle slid in easily. -The applicator was kept in  the same position with the needle inserted to its full length. The free hand was used to keep the  applicator in the same position. Then the purple slider was unlocked by pushing it slightly down. The purple slider was then moved fully back until it stopped, delivering the Nexplanon capsule subcutaneously. The trocar was removed from the insertion site. -Nexplanon capsule was palpated by provider and patient to assure satisfactory placement. -A Bandage and a pressure pressing were applied to the area. Patient to keep the pressure dressing for 24 hrs and the bandage for 3-5 days. -Anticipatory guidance, as well as standard post-procedure care, was explained. -Return precautions are given. The patient tolerated the procedure well without complications. -Estimated blood loss was less than 0.5 mL Follow-up: 4-6 weeks, at which time the placement will be checked.

## 2018-08-13 NOTE — Patient Instructions (Signed)
We inserted the nexplanon today. It will be due out 08/13/2021  Nexplanon Instructions After Insertion   Keep bandage clean and dry for 24 hours   May use ice/Tylenol/Ibuprofen for soreness or pain   If you develop fever, drainage or increased warmth from incision site-contact office immediately

## 2019-01-07 DIAGNOSIS — N760 Acute vaginitis: Secondary | ICD-10-CM | POA: Diagnosis not present

## 2019-04-05 IMAGING — US US SOFT TISSUE HEAD/NECK
1 series · 12 of 12 positions shown · non-contrast
Comparison: None.

CLINICAL DATA: 26-year-old female with a history of neck pain

EXAM:
ULTRASOUND OF HEAD/NECK SOFT TISSUES
TECHNIQUE: Ultrasound examination of the head and neck soft tissues was
performed in the area of clinical concern.

[Series 1: us soft tissue head/neck · 0.04mm/px · 12 of 12 slices shown]
[im 1/12]
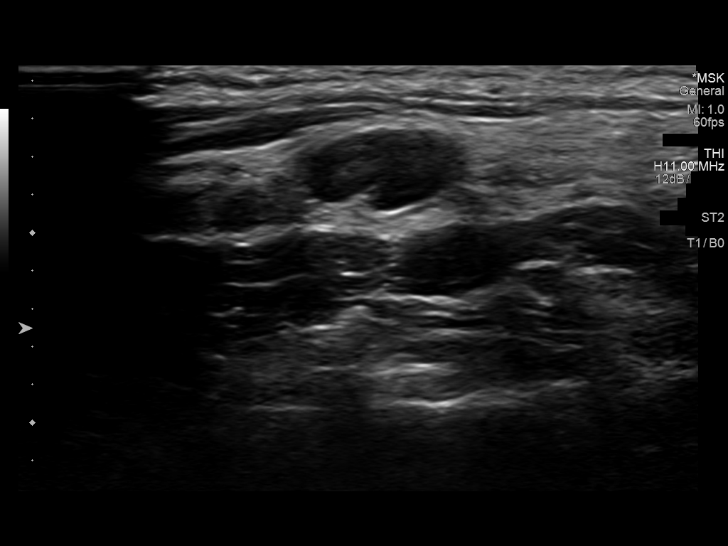
[im 2/12]
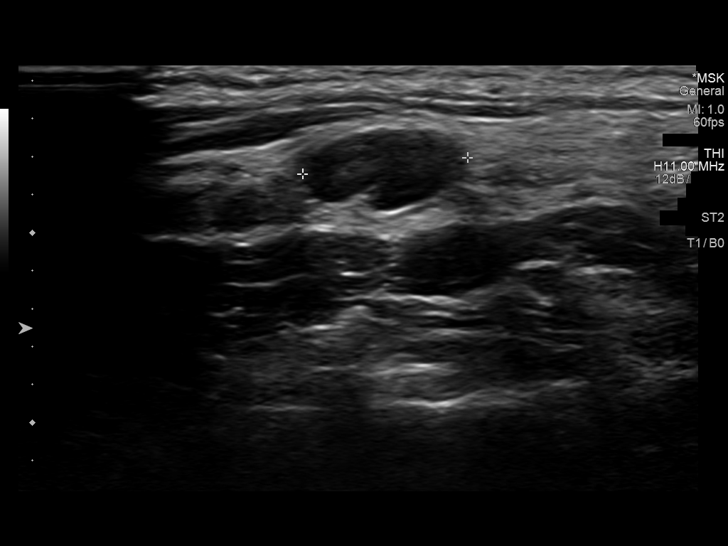
[im 3/12]
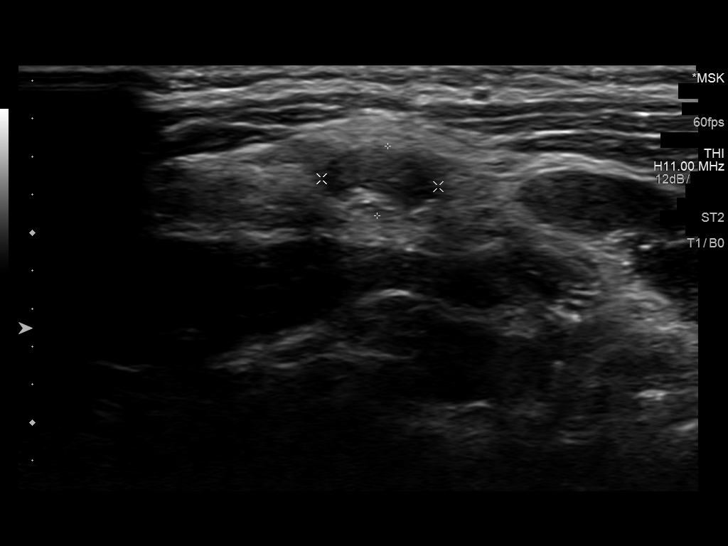
[im 4/12]
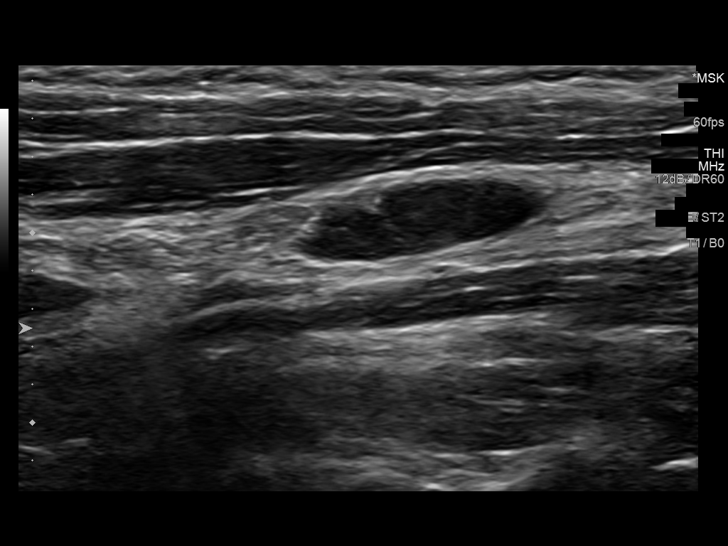
[im 5/12]
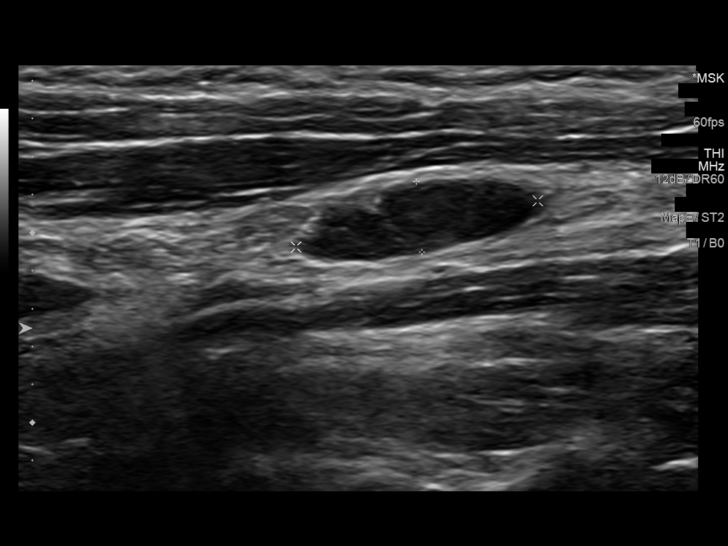
[im 6/12]
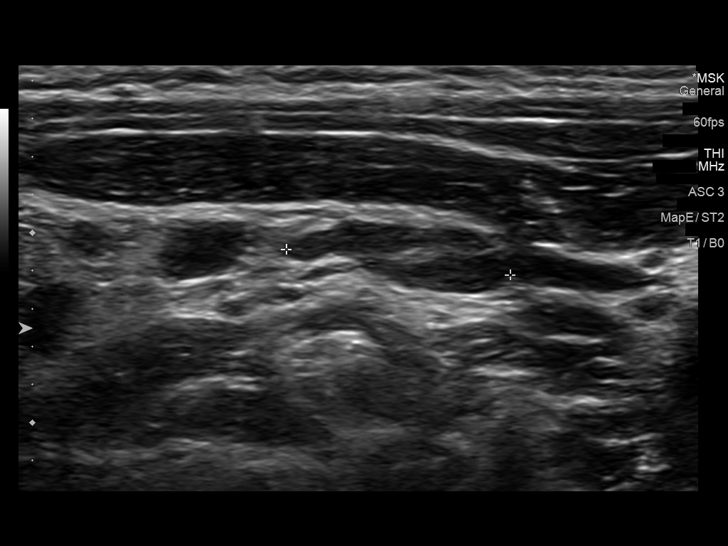
[im 7/12]
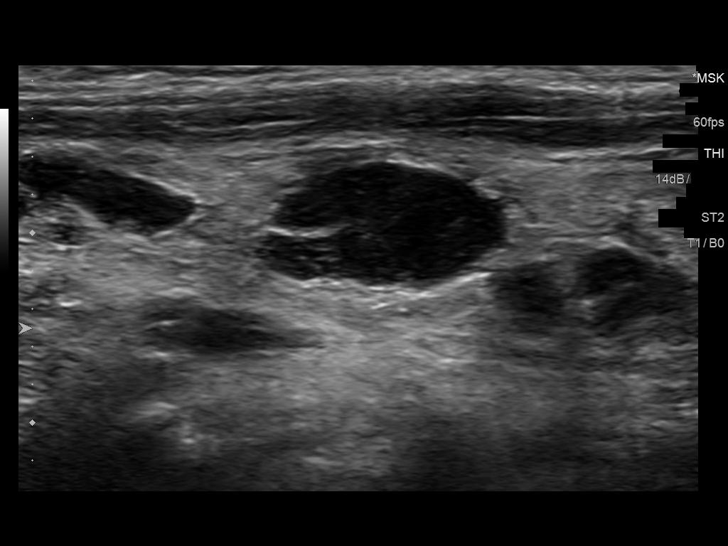
[im 8/12]
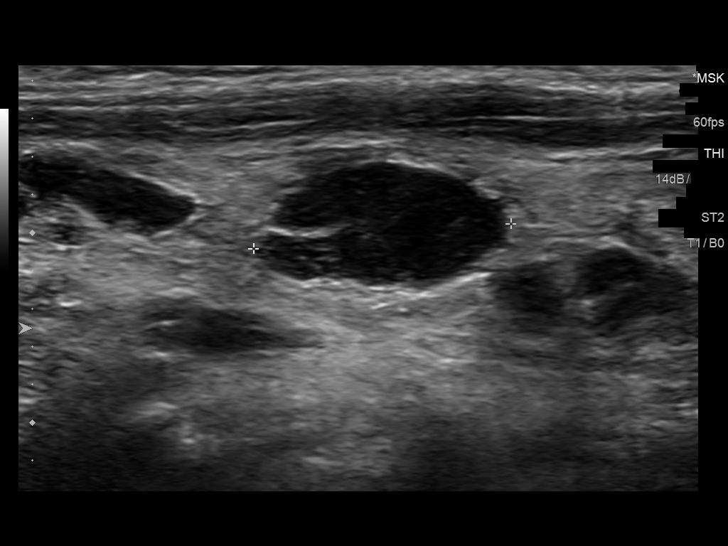
[im 9/12]
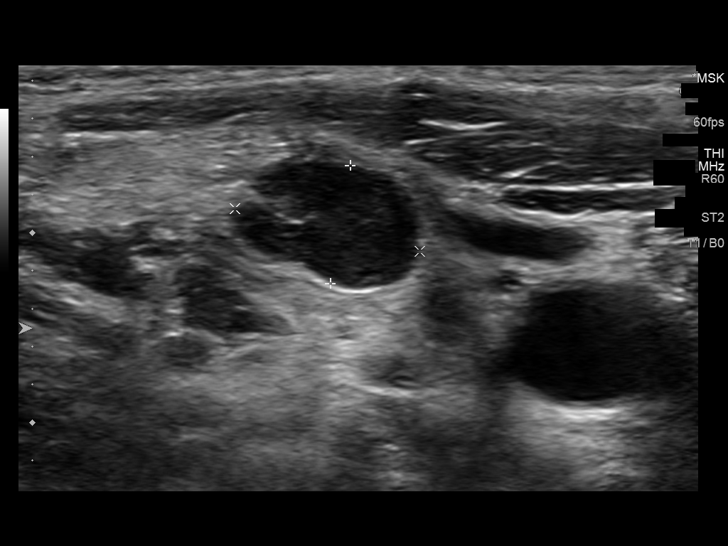
[im 10/12]
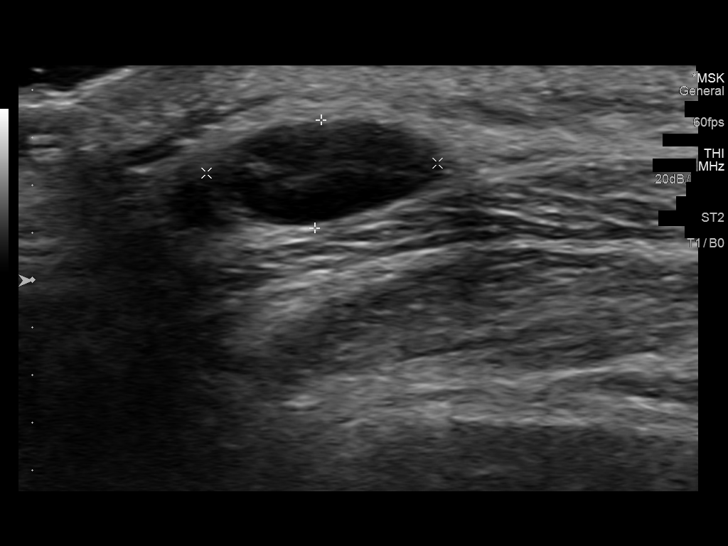
[im 11/12]
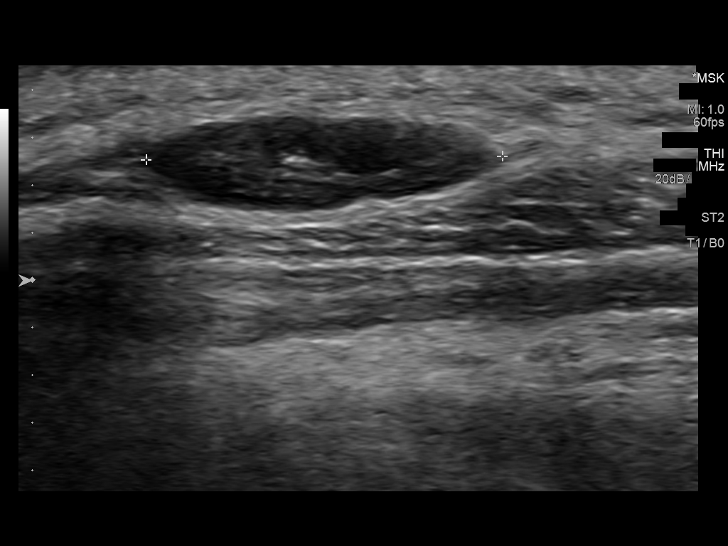
[im 12/12]
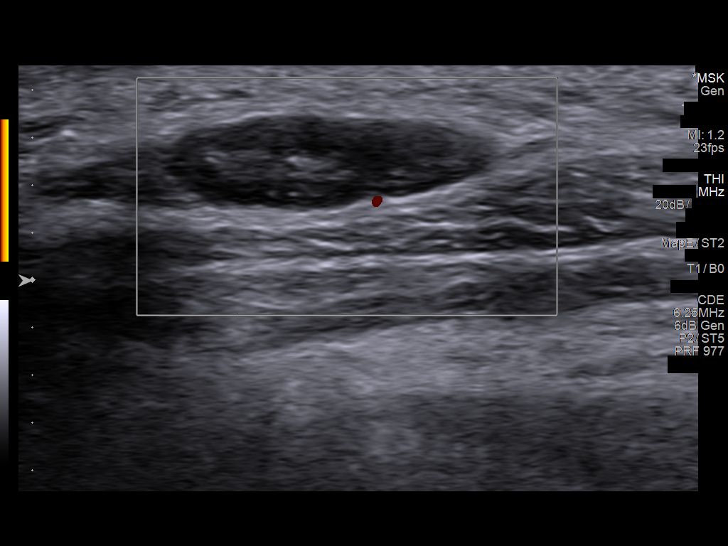

[12 of 12 positions shown; findings below may reference images not displayed]

FINDINGS: Directed duplex performed in the region clinical concern.

No focal fluid.  No soft tissue lesion.

Lymph nodes present in the region of clinical concern, all of which
maintain typical architecture without enlargement.
IMPRESSION: Lymph nodes in the region of clinical concern with no enlargement,
and typical architecture, potentially reactive. If there is concern
for lymphoproliferative disorder, recommend correlation with
presentation and labs.

## 2019-06-03 ENCOUNTER — Ambulatory Visit (INDEPENDENT_AMBULATORY_CARE_PROVIDER_SITE_OTHER): Payer: Medicaid Other | Admitting: Family Medicine

## 2019-06-03 ENCOUNTER — Other Ambulatory Visit: Payer: Self-pay

## 2019-06-03 ENCOUNTER — Encounter: Payer: Self-pay | Admitting: Family Medicine

## 2019-06-03 ENCOUNTER — Other Ambulatory Visit (HOSPITAL_COMMUNITY)
Admission: RE | Admit: 2019-06-03 | Discharge: 2019-06-03 | Disposition: A | Discharge status: Home / Self Care | Payer: Medicaid Other | Source: Ambulatory Visit | Attending: Family Medicine | Admitting: Family Medicine

## 2019-06-03 VITALS — BP 102/60 | HR 76 | Temp 98.2°F | Resp 22 | Ht 65.0 in | Wt 171.6 lb

## 2019-06-03 DIAGNOSIS — Z975 Presence of (intrauterine) contraceptive device: Secondary | ICD-10-CM | POA: Diagnosis not present

## 2019-06-03 DIAGNOSIS — Z0000 Encounter for general adult medical examination without abnormal findings: Secondary | ICD-10-CM | POA: Diagnosis not present

## 2019-06-03 DIAGNOSIS — Z124 Encounter for screening for malignant neoplasm of cervix: Secondary | ICD-10-CM | POA: Diagnosis not present

## 2019-06-03 LAB — COMPREHENSIVE METABOLIC PANEL
ALT: 23 U/L (ref 0–35)
AST: 17 U/L (ref 0–37)
Albumin: 3.8 g/dL (ref 3.5–5.2)
Alkaline Phosphatase: 37 U/L — ABNORMAL LOW (ref 39–117)
BUN: 8 mg/dL (ref 6–23)
CO2: 23 mEq/L (ref 19–32)
Calcium: 8.9 mg/dL (ref 8.4–10.5)
Chloride: 107 mEq/L (ref 96–112)
Creatinine, Ser: 0.69 mg/dL (ref 0.40–1.20)
GFR: 124.21 mL/min (ref >60.00)
Glucose, Bld: 96 mg/dL (ref 70–99)
Potassium: 3.6 mEq/L (ref 3.5–5.1)
Sodium: 137 mEq/L (ref 135–145)
Total Bilirubin: 0.4 mg/dL (ref 0.2–1.2)
Total Protein: 7.4 g/dL (ref 6.0–8.3)

## 2019-06-03 LAB — CBC WITH DIFFERENTIAL/PLATELET
Basophils Absolute: 0 10*3/uL (ref 0.0–0.1)
Basophils Relative: 0.5 % (ref 0.0–3.0)
Eosinophils Absolute: 0 10*3/uL (ref 0.0–0.7)
Eosinophils Relative: 0.8 % (ref 0.0–5.0)
HCT: 37.4 % (ref 36.0–46.0)
Hemoglobin: 12.6 g/dL (ref 12.0–15.0)
Lymphocytes Relative: 51.5 % — ABNORMAL HIGH (ref 12.0–46.0)
Lymphs Abs: 2.3 10*3/uL (ref 0.7–4.0)
MCHC: 33.8 g/dL (ref 30.0–36.0)
MCV: 93.2 fl (ref 78.0–100.0)
Monocytes Absolute: 0.4 10*3/uL (ref 0.1–1.0)
Monocytes Relative: 10.2 % (ref 3.0–12.0)
Neutro Abs: 1.6 10*3/uL (ref 1.4–7.7)
Neutrophils Relative %: 37 % — ABNORMAL LOW (ref 43.0–77.0)
Platelets: 224 10*3/uL (ref 150.0–400.0)
RBC: 4.02 Mil/uL (ref 3.87–5.11)
RDW: 13.6 % (ref 11.5–15.5)
WBC: 4.4 10*3/uL (ref 4.0–10.5)

## 2019-06-03 LAB — LIPID PANEL
Cholesterol: 156 mg/dL (ref 0–200)
HDL: 43.6 mg/dL (ref >39.00)
LDL Cholesterol: 103 mg/dL — ABNORMAL HIGH (ref 0–99)
NonHDL: 112.47
Total CHOL/HDL Ratio: 4
Triglycerides: 48 mg/dL (ref 0.0–149.0)
VLDL: 9.6 mg/dL (ref 0.0–40.0)

## 2019-06-03 NOTE — Patient Instructions (Signed)
Please return in 12 months for your annual complete physical; please come fasting.  I will release your lab results to you on your MyChart account with further instructions. Please reply with any questions.   If you have any questions or concerns, please don't hesitate to send me a message via MyChart or call the office at [phone]. Thank you for visiting with us today! It's our pleasure caring for you.  Please do these things to maintain good health!   Exercise at least 30-45 minutes a day,  4-5 days a week.   Eat a low-fat diet with lots of fruits and vegetables, up to 7-9 servings per day.  Drink plenty of water daily. Try to drink 8 8oz glasses per day.  Seatbelts can save your life. Always wear your seatbelt.  Place Smoke Detectors on every level of your home and check batteries every year.  Schedule an appointment with an eye doctor for an eye exam every 1-2 years  Safe sex - use condoms to protect yourself from STDs if you could be exposed to these types of infections. Use birth control if you do not want to become pregnant and are sexually active.  Avoid heavy alcohol use. If you drink, keep it to less than 2 drinks/day and not every day.  Health Care Power of Attorney.  Choose someone you trust that could speak for you if you became unable to speak for yourself.  Depression is common in our stressful world.If you're feeling down or losing interest in things you normally enjoy, please come in for a visit.  If anyone is threatening or hurting you, please get help. Physical or Emotional Violence is never OK.   

## 2019-06-03 NOTE — Progress Notes (Signed)
Subjective  Chief Complaint  Patient presents with  . Annual Exam  . Gynecologic Exam    HPI: Joanne Santos is a 28 y.o. female who presents to [facility] today for a Female Wellness Visit.   Wellness Visit: annual visit with health maintenance review and exam with Pap   HM: due for pap today. Nonfasting. 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 Not dating now.   Nexplanon: no concerns.   H/o recurrent BV: saw [facility] for evaluation. dxd with ureaplasma   Assessment  1. Annual physical exam   2. Cervical cancer screening   3. Presence of subdermal contraceptive  - nexplanon #3 08/2018      Plan  Female Wellness Visit:  Age appropriate Health Maintenance and Prevention measures were discussed with patient. Included topics are cancer screening recommendations, ways to keep healthy (see AVS) including dietary and exercise recommendations, regular eye and dental care, use of seat belts, and avoidance of moderate alcohol use and tobacco use. Pap done today  BMI: discussed patient's BMI and encouraged positive lifestyle modifications to help get to or maintain a target BMI.  HM needs and immunizations were addressed and ordered. See below for orders. See HM and immunization section for updates. Refuses flu shot  Routine labs and screening tests ordered including cmp, cbc and lipids where appropriate.  Discussed recommendations regarding Vit D and calcium supplementation (see AVS)  H/o recurrent bv. Per gyn recs.   Nexplanon: no problems. Placed in February 2020  Follow up: Return in about 1 year (around 06/02/2020) for complete physical.   Orders Placed This Encounter  Procedures  . CBC w/Diff  . CMP  . Lipids   No orders of the defined types were placed in this encounter.     Lifestyle: Body mass index is 28.56 kg/m. Wt Readings from Last 3 Encounters:  06/03/19 171 lb 9.6  oz (77.8 kg)  08/13/18 180 lb 9.6 oz (81.9 kg)  07/13/18 181 lb 12.8 oz (82.5 kg)   Diet: general Exercise: intermittently,  Need for contraception: Yes, Nexplanon  Patient Active Problem List   Diagnosis Date Noted  . Presence of subdermal contraceptive  - nexplanon #3 08/2018 11/30/2017  . Genital herpes 10/07/2016    Overview:  By pcr from lesion   . Bacterial vaginosis 09/19/2016   Health Maintenance  Topic Date Due  . PAP-Cervical Cytology Screening  08/04/2018  . PAP SMEAR-Modifier  08/04/2018  . TETANUS/TDAP  12/18/2022  . HIV Screening  Completed   Immunization History  Administered Date(s) Administered  . HPV Quadrivalent 12/14/2007, 11/03/2009, 12/17/2012  . Tdap 12/17/2012   We updated and reviewed the patient's past history in detail and it is documented below. Allergies: Patient has No Known Allergies. Past Medical History Patient  has a past medical history of BV (bacterial vaginosis) and Febrile seizure (HCC). Past Surgical History Patient  has a past surgical history that includes Cesarean section (2016). Family History: Patient family history includes Diabetes in her father and maternal grandmother; Early death in her mother; Hypertension in her father and sister; Stroke in her maternal grandmother. Social History:  Patient  reports that she has never smoked. She has never used smokeless tobacco. She reports current alcohol use. She reports that she does not use drugs.  Review of Systems: Constitutional: negative for fever or malaise Ophthalmic: negative for photophobia, double vision or loss of vision Cardiovascular: negative  for chest pain, dyspnea on exertion, or new LE swelling Respiratory: negative for SOB or persistent cough Gastrointestinal: negative for abdominal pain, change in bowel habits or melena Genitourinary: negative for dysuria or gross hematuria, no abnormal uterine bleeding or disharge Musculoskeletal: negative for new gait  disturbance or muscular weakness Integumentary: negative for new or persistent rashes, no breast lumps Neurological: negative for TIA or stroke symptoms Psychiatric: negative for SI or delusions Allergic/Immunologic: negative for hives Patient Care Team    Relationship Specialty Notifications Start End  Leamon Arnt, MD PCP - General Family Medicine  04/11/17     Objective  Vitals: BP 102/60 (BP Location: Left Arm, Patient Position: Sitting, Cuff Size: Normal)   Pulse 76   Temp 98.2 F (36.8 C) (Skin)   Resp (!) 22   Ht 5\' 5"  (1.651 m)   Wt 171 lb 9.6 oz (77.8 kg)   LMP  (LMP Unknown)   BMI 28.56 kg/m  General:  Well developed, well nourished, no acute distress  Psych:  Alert and orientedx3,normal mood and affect HEENT:  Normocephalic, atraumatic, non-icteric sclera, PERRL, oropharynx is clear without mass or exudate, supple neck without adenopathy, mass or thyromegaly Cardiovascular:  Normal S1, S2, RRR without gallop, rub or murmur, nondisplaced PMI Respiratory:  Good breath sounds bilaterally, CTAB with normal respiratory effort Gastrointestinal: normal bowel sounds, soft, non-tender, no noted masses. No HSM MSK: no deformities, contusions. Joints are without erythema or swelling. Spine and CVA region are nontender Skin:  Warm, no rashes or suspicious lesions noted, palpable nexplanon in left upper extremity Neurologic:    Mental status is normal. CN 2-11 are normal. Gross motor and sensory exams are normal. Normal gait. No tremor Breast Exam: No mass, skin retraction or nipple discharge is appreciated in either breast. No axillary adenopathy. Fibrocystic changes are not noted Pelvic Exam: Normal external genitalia, no vulvar or vaginal lesions present. Clear cervix w/o CMT but cream present in vault. Bimanual exam reveals a nontender fundus w/o masses, nl size. No adnexal masses present. No inguinal adenopathy. A PAP smear was performed.    Commons side effects, risks,  benefits, and alternatives for medications and treatment plan prescribed today were discussed, and the patient expressed understanding of the given instructions. Patient is instructed to call or message via MyChart if he/she has any questions or concerns regarding our treatment plan. No barriers to understanding were identified. We discussed Red Flag symptoms and signs in detail. Patient expressed understanding regarding what to do in case of urgent or emergency type symptoms.   Medication list was reconciled, printed and provided to the patient in AVS. Patient instructions and summary information was reviewed with the patient as documented in the AVS. This note was prepared with assistance of Dragon voice recognition software. Occasional wrong-word or sound-a-like substitutions may have occurred due to the inherent limitations of voice recognition software  This visit occurred during the SARS-CoV-2 public health emergency.  Safety protocols were in place, including screening questions prior to the visit, additional usage of staff PPE, and extensive cleaning of exam room while observing appropriate contact time as indicated for disinfecting solutions.

## 2019-06-04 LAB — CYTOLOGY - PAP
Adequacy: ABSENT
Diagnosis: NEGATIVE

## 2019-06-19 DIAGNOSIS — B373 Candidiasis of vulva and vagina: Secondary | ICD-10-CM | POA: Diagnosis not present

## 2019-06-19 DIAGNOSIS — N760 Acute vaginitis: Secondary | ICD-10-CM | POA: Diagnosis not present

## 2019-08-21 ENCOUNTER — Ambulatory Visit (INDEPENDENT_AMBULATORY_CARE_PROVIDER_SITE_OTHER): Payer: Medicaid Other | Admitting: Family Medicine

## 2019-08-21 ENCOUNTER — Other Ambulatory Visit (HOSPITAL_COMMUNITY)
Admission: RE | Admit: 2019-08-21 | Discharge: 2019-08-21 | Disposition: A | Discharge status: Home / Self Care | Payer: Medicaid Other | Source: Ambulatory Visit | Attending: Family Medicine | Admitting: Family Medicine

## 2019-08-21 ENCOUNTER — Other Ambulatory Visit: Payer: Self-pay

## 2019-08-21 ENCOUNTER — Encounter: Payer: Self-pay | Admitting: Family Medicine

## 2019-08-21 VITALS — BP 110/62 | HR 86 | Temp 96.3°F | Ht 65.0 in | Wt 169.8 lb

## 2019-08-21 DIAGNOSIS — A6004 Herpesviral vulvovaginitis: Secondary | ICD-10-CM | POA: Diagnosis not present

## 2019-08-21 DIAGNOSIS — N898 Other specified noninflammatory disorders of vagina: Secondary | ICD-10-CM | POA: Diagnosis not present

## 2019-08-21 MED ORDER — VALACYCLOVIR HCL 500 MG PO TABS: 500.0000 mg | ORAL_TABLET | Freq: Two times a day (BID) | ORAL | 2 refills | Status: DC

## 2019-08-21 NOTE — Progress Notes (Signed)
   Subjective  CC:  Chief Complaint  Patient presents with  . Vaginal Discharge    thick white discharge and herpes outbreak. Has not been sexually active    HPI: Joanne Santos is a 29 y.o. female who presents to the office today to address the problems listed above in the chief complaint.  Patient presents for evaluation of an abnormal vaginal discharge: she describes a thin discharge for about a month, smelled fishy last week but not now without itching or pelvic pain. Yesterday she noted a sore spot and thinks it could be genital HSV outbreak. Hasn't been sexually active for over a month. Sexually transmitted infection risk: Very low risk of STD exposure. She declines serology testing and HIV testing today. She denies urinary sxs. She uses Nexplanon for birth control.  I reviewed the patients updated PMH, FH, and SocHx.    Patient Active Problem List   Diagnosis Date Noted  . Presence of subdermal contraceptive  - nexplanon #3 08/2018 11/30/2017  . Genital herpes 10/07/2016  . Bacterial vaginosis 09/19/2016   Current Meds  Medication Sig  . etonogestrel (NEXPLANON) 68 MG IMPL implant Nexplanon 68 mg subdermal implant  Inject 1 implant by subcutaneous route.    Allergies: Patient has No Known Allergies. Family History: Patient family history includes Diabetes in her father and maternal grandmother; Early death in her mother; Hypertension in her father and sister; Stroke in her maternal grandmother. Social History:  Patient  reports that she has never smoked. She has never used smokeless tobacco. She reports current alcohol use. She reports that she does not use drugs.  Review of Systems: Constitutional: Negative for fever malaise or anorexia Cardiovascular: negative for chest pain Respiratory: negative for SOB or persistent cough Gastrointestinal: negative for abdominal pain  Objective  Vitals: There were no vitals taken for this visit. General: no acute distress , A&Ox3  GYN: left inferior perineum with 2-3 mm shallow ulcerated lesion w/o drainage. No significant discharge noted externally. No other lesions present.  Skin:  Warm, no rashes  Assessment  1. Herpes simplex vulvovaginitis   2. Vaginal discharge      Plan   HSV:  Treat for outbreak with valtrex 500bid x 3 days. advil and supportive care  Vaginal discharge: h/o recurrent BV. Await test results and treat as indicated.   Follow up: prn    Commons side effects, risks, benefits, and alternatives for medications and treatment plan prescribed today were discussed, and the patient expressed understanding of the given instructions. Patient is instructed to call or message via MyChart if he/she has any questions or concerns regarding our treatment plan. No barriers to understanding were identified. We discussed Red Flag symptoms and signs in detail. Patient expressed understanding regarding what to do in case of urgent or emergency type symptoms.   Medication list was reconciled, printed and provided to the patient in AVS. Patient instructions and summary information was reviewed with the patient as documented in the AVS. This note was prepared with assistance of Dragon voice recognition software. Occasional wrong-word or sound-a-like substitutions may have occurred due to the inherent limitations of voice recognition software  No orders of the defined types were placed in this encounter.  No orders of the defined types were placed in this encounter.

## 2019-08-21 NOTE — Patient Instructions (Signed)
Please follow up if symptoms do not improve or as needed.   Take the valtrex twice a day for 3 days.   I'll let you know if your test shows infection that needs to be treated.

## 2019-08-22 ENCOUNTER — Encounter: Payer: Self-pay | Admitting: Family Medicine

## 2019-08-22 LAB — CERVICOVAGINAL ANCILLARY ONLY
Bacterial Vaginitis (gardnerella): POSITIVE — AB
Candida Glabrata: NEGATIVE
Candida Vaginitis: NEGATIVE
Chlamydia: NEGATIVE
Comment: NEGATIVE
Comment: NEGATIVE
Comment: NEGATIVE
Comment: NEGATIVE
Comment: NEGATIVE
Comment: NORMAL
Neisseria Gonorrhea: NEGATIVE
Trichomonas: NEGATIVE

## 2019-08-23 MED ORDER — METRONIDAZOLE 500 MG PO TABS: 500.0000 mg | ORAL_TABLET | Freq: Two times a day (BID) | ORAL | 0 refills | Status: DC

## 2019-08-23 MED ORDER — CLINDAMYCIN HCL 300 MG PO CAPS: 300.0000 mg | ORAL_CAPSULE | Freq: Two times a day (BID) | ORAL | 0 refills | Status: DC

## 2019-08-23 NOTE — Telephone Encounter (Signed)
Patient does not want to do a cream, but also states that the metronidazole makes her sick so she would like to try something else, and asked for a call when something is placed.

## 2019-08-23 NOTE — Addendum Note (Signed)
Addended by: Asencion Partridge on: 08/23/2019 02:22 PM   Modules accepted: Orders

## 2019-09-23 IMAGING — CR DG LUMBAR SPINE COMPLETE 4+V
5 series · 5 of 5 positions shown · non-contrast
Comparison: None.

CLINICAL DATA: Low back pain since yesterday with no injury,some
radiculopathy in left thigh

EXAM:
LUMBAR SPINE - COMPLETE 4+ VIEW

[t l-spine a.p.]
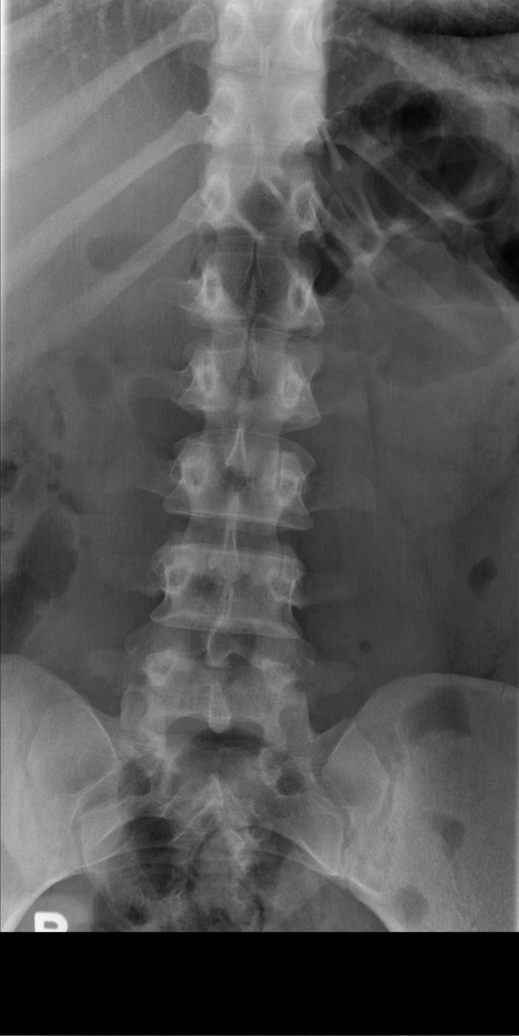

[t l-spine oblique exposure (1 of 2)]
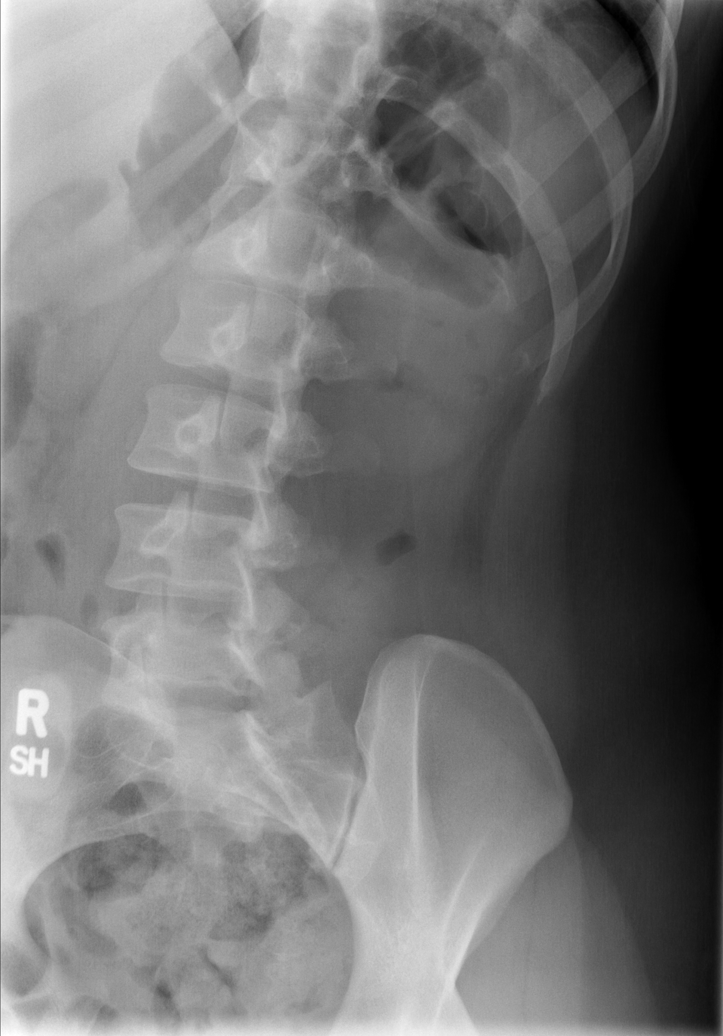

[t l-spine oblique exposure (2 of 2)]
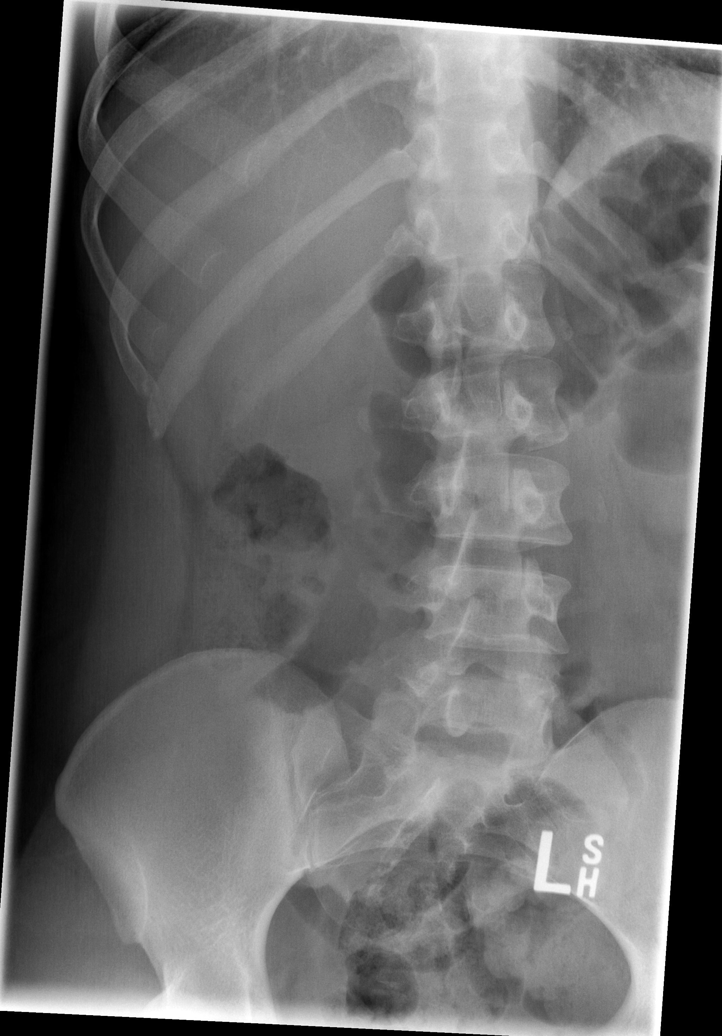

[t l-spine lat]
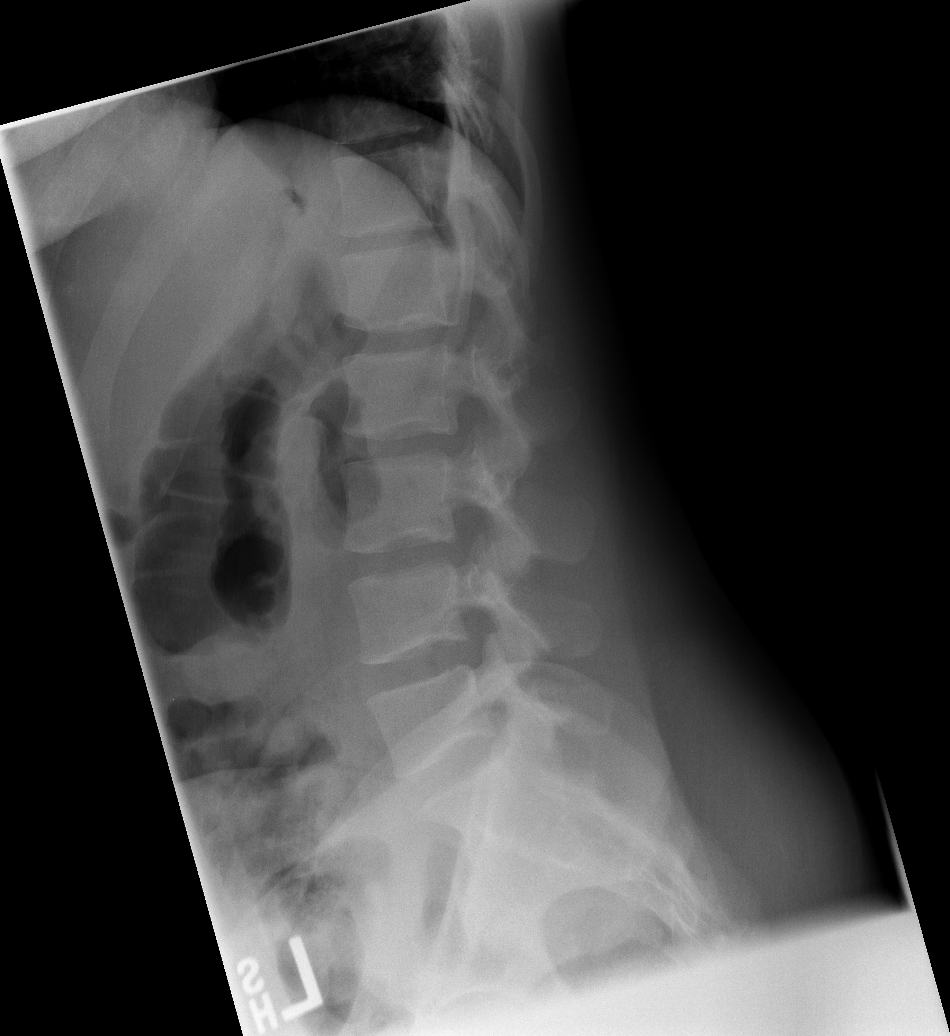

[t l-spine l5-s1 spot]
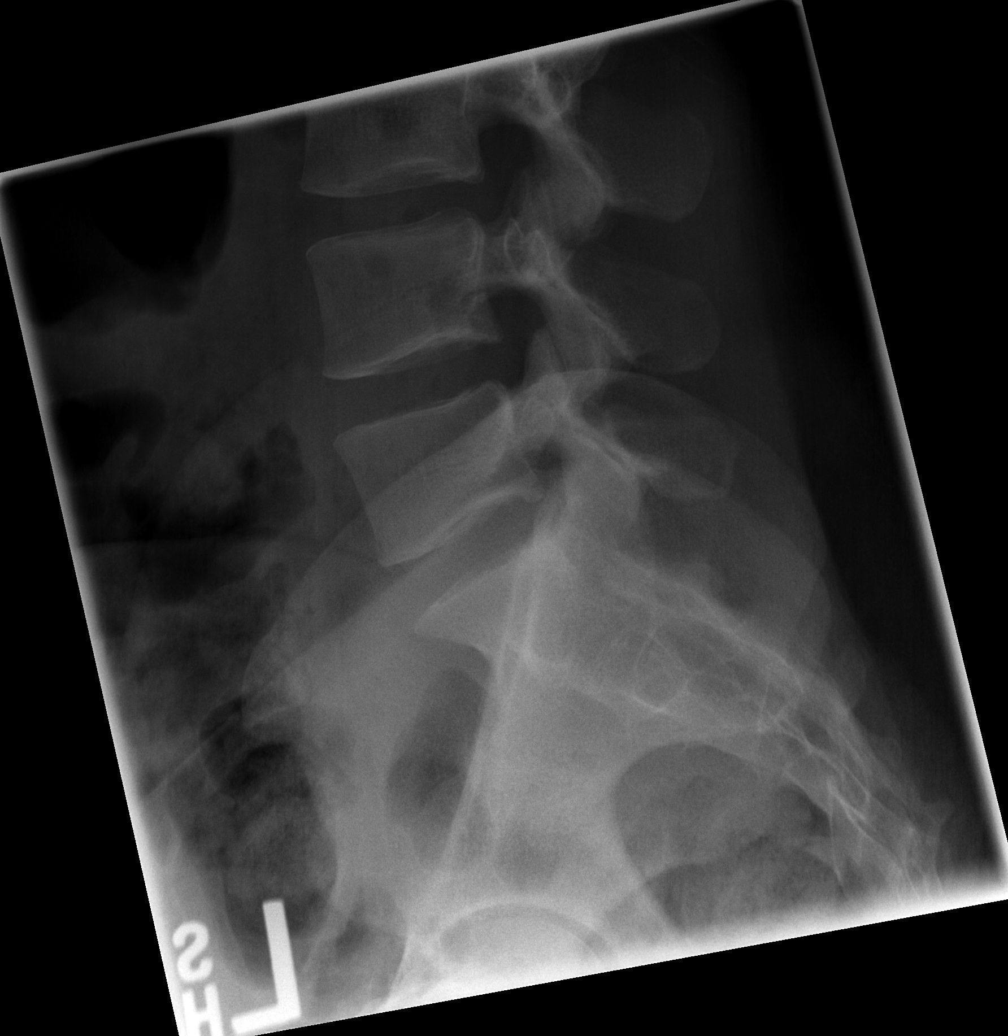

[5 of 5 positions shown; findings below may reference images not displayed]

FINDINGS: Five lumbar type vertebral bodies. Maintenance of vertebral body
height and alignment. Intervertebral disc heights are maintained.
IMPRESSION: No acute osseous abnormality.

## 2019-09-26 ENCOUNTER — Other Ambulatory Visit: Payer: Self-pay | Admitting: Family Medicine

## 2019-09-26 MED ORDER — BORIC ACID CRYS: CRYSTALS | 5 refills | Status: DC

## 2019-09-26 MED ORDER — FLUCONAZOLE 150 MG PO TABS: ORAL_TABLET | ORAL | 0 refills | Status: DC

## 2019-09-26 MED ORDER — CLINDAMYCIN HCL 300 MG PO CAPS: 300.0000 mg | ORAL_CAPSULE | Freq: Two times a day (BID) | ORAL | 0 refills | Status: DC

## 2019-09-26 NOTE — Telephone Encounter (Signed)
Please see request and message with request.

## 2019-10-18 ENCOUNTER — Telehealth: Payer: Self-pay | Admitting: Family Medicine

## 2019-10-18 NOTE — Telephone Encounter (Signed)
error 

## 2019-10-23 ENCOUNTER — Telehealth: Payer: Self-pay | Admitting: Family Medicine

## 2019-10-23 NOTE — Telephone Encounter (Signed)
Left pt vm to call me back in regard to scheduled OV.

## 2019-10-24 ENCOUNTER — Telehealth (INDEPENDENT_AMBULATORY_CARE_PROVIDER_SITE_OTHER): Payer: Medicaid Other | Admitting: Family Medicine

## 2019-10-24 ENCOUNTER — Other Ambulatory Visit: Payer: Self-pay

## 2019-10-24 ENCOUNTER — Encounter: Payer: Self-pay | Admitting: Family Medicine

## 2019-10-24 DIAGNOSIS — H0489 Other disorders of lacrimal system: Secondary | ICD-10-CM | POA: Diagnosis not present

## 2019-10-24 DIAGNOSIS — H049 Disorder of lacrimal system, unspecified: Secondary | ICD-10-CM

## 2019-10-24 MED ORDER — ERYTHROMYCIN 5 MG/GM OP OINT: 1.0000 "application " | TOPICAL_OINTMENT | Freq: Three times a day (TID) | OPHTHALMIC | 0 refills | Status: DC

## 2019-10-24 NOTE — Progress Notes (Signed)
     Virtual Visit via Video Note  Subjective  CC:  Chief Complaint  Patient presents with  . Eye Problem    poked right eye with tweezers while applying false eyelashes last year      I connected with Duanne Limerick on 10/24/19 at  9:00 AM EDT by a video enabled telemedicine application and verified that I am speaking with the correct person using two identifiers. Location patient: Home Location provider: [facility] [address] Office Persons participating in the virtual visit: Marybelle Killings, MD Gracy Racer, CMA  I discussed the limitations of evaluation and management by telemedicine and the availability of in person appointments. The patient expressed understanding and agreed to proceed. HPI: Joanne Santos is a 29 y.o. female who was contacted today to address the problems listed above in the chief complaint. . Pt poked the upper inner eye fold last years with tweezers. No major injury noted at that time except for mild soreness. Now, reports 2 months of intermittent and worsening soreness in that area with a "little hole and bump" that is tender to touch and irritated. She denies photophobia, redness of eye, visual disturbance, or drainage. She denies allergy sxs or cold sxs. She otherwise feels well.   Assessment  1. Defect of lacrimal duct      Plan   Eye problem:  May be blocked or infected lacrimal duct. Doubt related to trauma but hard to examine/see on video. Will treat with e-mycin ointment, warm compresses and massage and f/u if not improving. Patient understands and agrees with care plan.   I discussed the assessment and treatment plan with the patient. The patient was provided an opportunity to ask questions and all were answered. The patient agreed with the plan and demonstrated an understanding of the instructions.   The patient was advised to call back or seek an in-person evaluation if the symptoms worsen or if the condition  fails to improve as anticipated. Follow up: prn   I reviewed the patients updated PMH, FH, and SocHx.    Patient Active Problem List   Diagnosis Date Noted  . Presence of subdermal contraceptive  - nexplanon #3 08/2018 11/30/2017  . Genital herpes 10/07/2016  . Bacterial vaginosis 09/19/2016   Current Meds  Medication Sig  . etonogestrel (NEXPLANON) 68 MG IMPL implant Nexplanon 68 mg subdermal implant  Inject 1 implant by subcutaneous route.    Allergies: Patient has No Known Allergies. Family History: Patient family history includes Diabetes in her father and maternal grandmother; Early death in her mother; Hypertension in her father and sister; Stroke in her maternal grandmother. Social History:  Patient  reports that she has never smoked. She has never used smokeless tobacco. She reports current alcohol use. She reports that she does not use drugs.  Review of Systems: Constitutional: Negative for fever malaise or anorexia Cardiovascular: negative for chest pain Respiratory: negative for SOB or persistent cough Gastrointestinal: negative for abdominal pain  OBJECTIVE Vitals: There were no vitals taken for this visit. General: no acute distress , A&Ox3 Bilateral conjunctiva appear normal; right upper inner fold / lacrimal duct with small papilla or cyst. Doesn't appear red.  Willow Ora, MD

## 2019-10-25 ENCOUNTER — Telehealth: Payer: Medicaid Other | Admitting: Family Medicine

## 2019-12-18 DIAGNOSIS — Z113 Encounter for screening for infections with a predominantly sexual mode of transmission: Secondary | ICD-10-CM | POA: Diagnosis not present

## 2020-04-22 ENCOUNTER — Ambulatory Visit (INDEPENDENT_AMBULATORY_CARE_PROVIDER_SITE_OTHER): Payer: Medicaid Other | Admitting: Family Medicine

## 2020-04-22 ENCOUNTER — Other Ambulatory Visit: Payer: Self-pay

## 2020-04-22 ENCOUNTER — Encounter: Payer: Self-pay | Admitting: Family Medicine

## 2020-04-22 ENCOUNTER — Other Ambulatory Visit (HOSPITAL_COMMUNITY)
Admission: RE | Admit: 2020-04-22 | Discharge: 2020-04-22 | Disposition: A | Discharge status: Home / Self Care | Payer: Medicaid Other | Source: Ambulatory Visit | Attending: Family Medicine | Admitting: Family Medicine

## 2020-04-22 VITALS — BP 112/70 | HR 80 | Temp 98.9°F | Wt 176.4 lb

## 2020-04-22 DIAGNOSIS — N898 Other specified noninflammatory disorders of vagina: Secondary | ICD-10-CM

## 2020-04-22 DIAGNOSIS — B360 Pityriasis versicolor: Secondary | ICD-10-CM | POA: Diagnosis not present

## 2020-04-22 MED ORDER — KETOCONAZOLE 2 % EX CREA: 1.0000 "application " | TOPICAL_CREAM | Freq: Two times a day (BID) | CUTANEOUS | 0 refills | Status: AC

## 2020-04-22 NOTE — Patient Instructions (Addendum)
Please return for your annual complete physical; please come fasting.  If you have any questions or concerns, please don't hesitate to send me a message via MyChart or call the office at [phone]. Thank you for visiting with [place] today! It's our pleasure caring for you.   Tinea Versicolor  Tinea versicolor is a common fungal infection of the skin. It causes a rash that appears as light or dark patches on the skin. The rash most often occurs on the chest, back, neck, or upper arms. This condition is more common during warm weather. Other than affecting how your skin looks, tinea versicolor usually does not cause other problems. In most cases, the infection goes away in a few weeks with treatment. It may take a few months for the patches on your skin to return to your usual skin color. What are the causes? This condition occurs when a type of fungus that is normally present on the skin starts to overgrow. This fungus is a kind of yeast. The exact cause of the overgrowth is not known. This condition cannot be passed from one person to another (it is not contagious). What increases the risk? This condition is more likely to develop when certain factors are present, such as:  Heat and humidity.  Sweating too much.  Hormone changes.  Oily skin.  A weak disease-fighting system (immunesystem). What are the signs or symptoms? Symptoms of this condition include:  A rash of light or dark patches on your skin. The rash may have: ? Patches of tan or pink spots (on light skin). ? Patches of white or brown spots (on dark skin). ? Patches of skin that do not tan. ? Well-marked edges. ? Scales on the discolored areas.  Mild itching. How is this diagnosed? A health care provider can usually diagnose this condition by looking at your skin. During the exam, he or she may use ultraviolet (UV) light to see how much of your skin has been affected. In some cases, a skin sample may be taken by scraping  the rash. This sample will be viewed under a microscope to check for yeast overgrowth. How is this treated? Treatment for this condition may include:  Dandruff shampoo that is applied to the affected skin during showers or bathing.  Over-the-counter medicated skin cream, lotion, or soaps.  Prescription antifungal medicine in the form of skin cream or pills.  Medicine to help reduce itching. Follow these instructions at home:  Take over-the-counter and prescription medicines only as told by your health care provider.  Apply dandruff shampoo to the affected area if your health care provider told you to do that. You may be instructed to scrub the affected skin for several minutes each day.  Do not scratch the affected area of skin.  Avoid hot and humid conditions.  Do not use tanning booths.  Try to avoid sweating a lot. Contact a health care provider if:  Your symptoms get worse.  You have a fever.  You have redness, swelling, or pain at the site of your rash.  You have fluid or blood coming from your rash.  Your rash feels warm to the touch.  You have pus or a bad smell coming from your rash.  Your rash returns (recurs) after treatment. Summary  Tinea versicolor is a common fungal infection of the skin. It causes a rash that appears as light or dark patches on the skin.  The rash most often occurs on the chest, back, neck, or upper  arms.  A health care provider can usually diagnose this condition by looking at your skin.  Treatment may include applying shampoo to the skin and taking or applying medicines. This information is not intended to replace advice given to you by your health care provider. Make sure you discuss any questions you have with your health care provider. Document Revised: 06/02/2017 Document Reviewed: 02/21/2017 Elsevier Patient Education  2020 [employer].

## 2020-04-22 NOTE — Progress Notes (Signed)
Subjective  CC:  Chief Complaint  Patient presents with  . Rash    between breast and upper back    HPI: Joanne Santos is a 29 y.o. female who presents to the office today to address the problems listed above in the chief complaint.  Rash on chest and back; mild itchy x several months.   No systemic sxs  Vaginal discharge, thin and clear. W/o ithcing or pelvic pain. H/o recurrent bv.    Assessment  1. Tinea versicolor   2. Vaginal discharge      Plan   TV:  Educated. ketoconazaoel cream bid x 2 weeks.   Check for bv/trich/yeast/chl/gc  Follow up: Return for complete physical.  Visit date not found  No orders of the defined types were placed in this encounter.  Meds ordered this encounter  Medications  . ketoconazole (NIZORAL) 2 % cream    Sig: Apply 1 application topically 2 (two) times daily for 14 days. Then as needed    Dispense:  60 g    Refill:  0      I reviewed the patients updated PMH, FH, and SocHx.    Patient Active Problem List   Diagnosis Date Noted  . Presence of subdermal contraceptive  - nexplanon #3 08/2018 11/30/2017  . Genital herpes 10/07/2016  . Bacterial vaginosis 09/19/2016   Current Meds  Medication Sig  . erythromycin ophthalmic ointment Place 1 application into the left eye 3 (three) times daily.  Marland Kitchen etonogestrel (NEXPLANON) 68 MG IMPL implant Nexplanon 68 mg subdermal implant  Inject 1 implant by subcutaneous route.    Allergies: Patient has No Known Allergies. Family History: Patient family history includes Diabetes in her father and maternal grandmother; Early death in her mother; Hypertension in her father and sister; Stroke in her maternal grandmother. Social History:  Patient  reports that she has never smoked. She has never used smokeless tobacco. She reports current alcohol use. She reports that she does not use drugs.  Review of Systems: Constitutional: Negative for fever malaise or anorexia Cardiovascular:  negative for chest pain Respiratory: negative for SOB or persistent cough Gastrointestinal: negative for abdominal pain  Objective  Vitals: BP 112/70   Pulse 80   Temp 98.9 F (37.2 C) (Temporal)   Wt 176 lb 6.4 oz (80 kg)   SpO2 98%   BMI 29.35 kg/m  General: no acute distress , A&Ox3  Skin:  Warm, hypopigmented versicolor rash over sternum and back     Commons side effects, risks, benefits, and alternatives for medications and treatment plan prescribed today were discussed, and the patient expressed understanding of the given instructions. Patient is instructed to call or message via MyChart if he/she has any questions or concerns regarding our treatment plan. No barriers to understanding were identified. We discussed Red Flag symptoms and signs in detail. Patient expressed understanding regarding what to do in case of urgent or emergency type symptoms.   Medication list was reconciled, printed and provided to the patient in AVS. Patient instructions and summary information was reviewed with the patient as documented in the AVS. This note was prepared with assistance of Dragon voice recognition software. Occasional wrong-word or sound-a-like substitutions may have occurred due to the inherent limitations of voice recognition software  This visit occurred during the SARS-CoV-2 public health emergency.  Safety protocols were in place, including screening questions prior to the visit, additional usage of staff PPE, and extensive cleaning of exam room while observing appropriate  contact time as indicated for disinfecting solutions.

## 2020-04-24 ENCOUNTER — Other Ambulatory Visit: Payer: Self-pay

## 2020-04-24 ENCOUNTER — Encounter: Payer: Self-pay | Admitting: Family Medicine

## 2020-04-24 LAB — CERVICOVAGINAL ANCILLARY ONLY
Bacterial Vaginitis (gardnerella): POSITIVE — AB
Candida Glabrata: NEGATIVE
Candida Vaginitis: NEGATIVE
Chlamydia: NEGATIVE
Comment: NEGATIVE
Comment: NEGATIVE
Comment: NEGATIVE
Comment: NEGATIVE
Comment: NEGATIVE
Comment: NORMAL
Neisseria Gonorrhea: NEGATIVE
Trichomonas: NEGATIVE

## 2020-04-24 MED ORDER — FLUCONAZOLE 150 MG PO TABS: ORAL_TABLET | ORAL | 0 refills | Status: DC

## 2020-04-24 MED ORDER — METRONIDAZOLE 500 MG PO TABS: 500.0000 mg | ORAL_TABLET | Freq: Two times a day (BID) | ORAL | 0 refills | Status: AC

## 2020-04-24 NOTE — Addendum Note (Signed)
Addended by: Asencion Partridge on: 04/24/2020 01:55 PM   Modules accepted: Orders

## 2020-05-04 ENCOUNTER — Other Ambulatory Visit (HOSPITAL_COMMUNITY)
Admission: RE | Admit: 2020-05-04 | Discharge: 2020-05-04 | Disposition: A | Discharge status: Home / Self Care | Payer: Medicaid Other | Source: Ambulatory Visit | Attending: Family Medicine | Admitting: Family Medicine

## 2020-05-04 ENCOUNTER — Other Ambulatory Visit: Payer: Medicaid Other

## 2020-05-04 ENCOUNTER — Other Ambulatory Visit: Payer: Self-pay

## 2020-05-04 DIAGNOSIS — N898 Other specified noninflammatory disorders of vagina: Secondary | ICD-10-CM | POA: Insufficient documentation

## 2020-05-04 NOTE — Telephone Encounter (Signed)
I recommend a repeat swab before prescribing another round of antibiotics.   Schedule lab visit for swab/

## 2020-05-06 LAB — CERVICOVAGINAL ANCILLARY ONLY
Bacterial Vaginitis (gardnerella): NEGATIVE
Candida Glabrata: NEGATIVE
Candida Vaginitis: NEGATIVE
Chlamydia: NEGATIVE
Comment: NEGATIVE
Comment: NEGATIVE
Comment: NEGATIVE
Comment: NEGATIVE
Comment: NEGATIVE
Comment: NORMAL
Neisseria Gonorrhea: NEGATIVE
Trichomonas: NEGATIVE

## 2020-05-18 DIAGNOSIS — Z20822 Contact with and (suspected) exposure to covid-19: Secondary | ICD-10-CM | POA: Diagnosis not present

## 2020-05-18 DIAGNOSIS — K529 Noninfective gastroenteritis and colitis, unspecified: Secondary | ICD-10-CM | POA: Diagnosis not present

## 2020-05-27 ENCOUNTER — Ambulatory Visit: Payer: Medicaid Other | Admitting: Family Medicine

## 2020-05-27 ENCOUNTER — Encounter: Payer: Self-pay | Admitting: Family Medicine

## 2020-05-27 ENCOUNTER — Other Ambulatory Visit: Payer: Self-pay

## 2020-05-27 VITALS — BP 104/70 | HR 90 | Temp 98.2°F | Ht 65.0 in | Wt 175.6 lb

## 2020-05-27 DIAGNOSIS — Z01818 Encounter for other preprocedural examination: Secondary | ICD-10-CM | POA: Diagnosis not present

## 2020-05-27 DIAGNOSIS — Z1159 Encounter for screening for other viral diseases: Secondary | ICD-10-CM

## 2020-05-27 DIAGNOSIS — Z0000 Encounter for general adult medical examination without abnormal findings: Secondary | ICD-10-CM

## 2020-05-27 NOTE — Patient Instructions (Signed)
Please return in 12 months for your annual complete physical; please come fasting.  I will release your lab results to you on your MyChart account with further instructions. Please reply with any questions.   If you have any questions or concerns, please don't hesitate to send me a message via MyChart or call the office at [phone]. Thank you for visiting with [place] today! It's our pleasure caring for you.  Hope your surgery goes very well!  Please do these things to maintain good health!   Exercise at least 30-45 minutes a day,  4-5 days a week.   Eat a low-fat diet with lots of fruits and vegetables, up to 7-9 servings per day.  Drink plenty of water daily. Try to drink 8 8oz glasses per day.  Seatbelts can save your life. Always wear your seatbelt.  Place Smoke Detectors on every level of your home and check batteries every year.  Schedule an appointment with an eye doctor for an eye exam every 1-2 years  Safe sex - use condoms to protect yourself from STDs if you could be exposed to these types of infections. Use birth control if you do not want to become pregnant and are sexually active.  Avoid heavy alcohol use. If you drink, keep it to less than 2 drinks/day and not every day.  Health Care Power of Attorney.  Choose someone you trust that could speak for you if you became unable to speak for yourself.  Depression is common in our stressful world.If you're feeling down or losing interest in things you normally enjoy, please come in for a visit.  If anyone is threatening or hurting you, please get help. Physical or Emotional Violence is never OK.

## 2020-05-27 NOTE — Progress Notes (Signed)
Subjective  Chief Complaint  Patient presents with   Pre-op Exam    breast lift and breast implants    HPI: Joanne Santos is a 29 y.o. female who presents to [facility] [place] today for a Female Wellness Visit. She also has the concerns and/or needs as listed above in the chief complaint. These will be addressed in addition to the Health Maintenance Visit.   Wellness Visit: annual visit with health maintenance review and exam without Pap   HM: pap up to date. Will have breast implants in [place] and needs preop clearance. See form that needs to be completed. Healthy 29 yo. No concerns. Non fasting   Assessment  1. Annual physical exam   2. Preoperative clearance   3. Need for hepatitis C screening test      Plan  Female Wellness Visit:  Age appropriate Health Maintenance and Prevention measures were discussed with patient. Included topics are cancer screening recommendations, ways to keep healthy (see AVS) including dietary and exercise recommendations, regular eye and dental care, use of seat belts, and avoidance of moderate alcohol use and tobacco use.   BMI: discussed patient's BMI and encouraged positive lifestyle modifications to help get to or maintain a target BMI.  HM needs and immunizations were addressed and ordered. See below for orders. See HM and immunization section for updates.  Routine labs and screening tests ordered including cmp, cbc and lipids where appropriate.  Discussed recommendations regarding Vit D and calcium supplementation (see AVS)  Preop clearance; labs ordered nl ekg. Form completed. Cleared for surgery. Low risk.    Follow up: 12 mo for cpe  Orders Placed This Encounter  Procedures   CBC with Differential/Platelet   COMPLETE METABOLIC PANEL WITH GFR   Lipid panel   PT and PTT   TSH   hCG, serum, qualitative   Hepatitis C antibody   No orders of the defined types were placed in this encounter.      Lifestyle: Body mass index is 29.22 kg/m. Wt Readings from Last 3 Encounters:  05/27/20 175 lb 9.6 oz (79.7 kg)  04/22/20 176 lb 6.4 oz (80 kg)  08/21/19 169 lb 12.8 oz (77 kg)    Patient Active Problem List   Diagnosis Date Noted   Presence of subdermal contraceptive  - nexplanon #3 08/2018 11/30/2017   Genital herpes 10/07/2016    Overview:  By pcr from lesion    Bacterial vaginosis 09/19/2016   Health Maintenance  Topic Date Due   Hepatitis C Screening  Never done   PAP-Cervical Cytology Screening  06/02/2022   PAP SMEAR-Modifier  06/02/2022   TETANUS/TDAP  12/18/2022   COVID-19 Vaccine  Completed   HIV Screening  Completed   Immunization History  Administered Date(s) Administered   HPV Quadrivalent 12/14/2007, 11/03/2009, 12/17/2012   Influenza,inj,Quad PF,6+ Mos 02/24/2020   Moderna SARS-COVID-2 Vaccination 02/24/2020, 03/23/2020   Tdap 12/17/2012   We updated and reviewed the patient's past history in detail and it is documented below. Allergies: Patient has No Known Allergies. Past Medical History Patient  has a past medical history of BV (bacterial vaginosis) and Febrile seizure (HCC). Past Surgical History Patient  has a past surgical history that includes Cesarean section (2016). Family History: Patient family history includes Diabetes in her father and maternal grandmother; Early death in her mother; Hypertension in her father and sister; Stroke in her maternal grandmother. Social History:  Patient  reports that she has never smoked. She  has never used smokeless tobacco. She reports current alcohol use. She reports that she does not use drugs.  Review of Systems: Constitutional: negative for fever or malaise Ophthalmic: negative for photophobia, double vision or loss of vision Cardiovascular: negative for chest pain, dyspnea on exertion, or new LE swelling Respiratory: negative for SOB or persistent cough Gastrointestinal: negative for  abdominal pain, change in bowel habits or melena Genitourinary: negative for dysuria or gross hematuria, no abnormal uterine bleeding or disharge Musculoskeletal: negative for new gait disturbance or muscular weakness Integumentary: negative for new or persistent rashes, no breast lumps Neurological: negative for TIA or stroke symptoms Psychiatric: negative for SI or delusions Allergic/Immunologic: negative for hives  Patient Care Team    Relationship Specialty Notifications Start End  Willow Ora, MD PCP - General Family Medicine  04/11/17     Objective  Vitals: BP 104/70    Pulse 90    Temp 98.2 F (36.8 C) (Temporal)    Ht 5\' 5"  (1.651 m)    Wt 175 lb 9.6 oz (79.7 kg)    SpO2 98%    BMI 29.22 kg/m  General:  Well developed, well nourished, no acute distress  Psych:  Alert and orientedx3,normal mood and affect HEENT:  Normocephalic, atraumatic, non-icteric sclera,  supple neck without adenopathy, mass or thyromegaly Cardiovascular:  Normal S1, S2, RRR without gallop, rub or murmur Respiratory:  Good breath sounds bilaterally, CTAB with normal respiratory effort Gastrointestinal: normal bowel sounds, soft, non-tender, no noted masses. No HSM MSK: no deformities, contusions. Joints are without erythema or swelling.  Skin:  Warm, no rashes or suspicious lesions noted Neurologic:    Mental status is normal. CN 2-11 are normal. Gross motor and sensory exams are normal. Normal gait. No tremor Breast Exam: No mass, skin retraction or nipple discharge is appreciated in either breast. No axillary adenopathy. Fibrocystic changes are not noted    Commons side effects, risks, benefits, and alternatives for medications and treatment plan prescribed today were discussed, and the patient expressed understanding of the given instructions. Patient is instructed to call or message via MyChart if he/she has any questions or concerns regarding our treatment plan. No barriers to understanding were  identified. We discussed Red Flag symptoms and signs in detail. Patient expressed understanding regarding what to do in case of urgent or emergency type symptoms.   Medication list was reconciled, printed and provided to the patient in AVS. Patient instructions and summary information was reviewed with the patient as documented in the AVS. This note was prepared with assistance of Dragon voice recognition software. Occasional wrong-word or sound-a-like substitutions may have occurred due to the inherent limitations of voice recognition software  This visit occurred during the SARS-CoV-2 public health emergency.  Safety protocols were in place, including screening questions prior to the visit, additional usage of staff PPE, and extensive cleaning of exam room while observing appropriate contact time as indicated for disinfecting solutions.

## 2020-05-28 LAB — LIPID PANEL: Total CHOL/HDL Ratio: 2.9 (calc) (ref <5.0)

## 2020-05-28 LAB — COMPLETE METABOLIC PANEL WITH GFR
Albumin: 4.3 g/dL (ref 3.6–5.1)
Alkaline phosphatase (APISO): 41 U/L (ref 31–125)
CO2: 27 mmol/L (ref 20–32)
Creat: 0.72 mg/dL (ref 0.50–1.10)

## 2020-05-28 LAB — CBC WITH DIFFERENTIAL/PLATELET
Basophils Relative: 0.4 %
HCT: 39.8 % (ref 35.0–45.0)
Neutro Abs: 1670 cells/uL (ref 1500–7800)
Platelets: 264 10*3/uL (ref 140–400)

## 2020-05-28 LAB — CP4508-PT/INR AND PTT
INR: 1
Prothrombin Time: 10.3 s (ref 9.0–11.5)
aPTT: 28 s (ref 23–32)

## 2020-05-29 LAB — COMPLETE METABOLIC PANEL WITH GFR
BUN: 13 mg/dL (ref 7–25)
GFR, Est Non African American: 115 mL/min/{1.73_m2} (ref >=60)
Glucose, Bld: 90 mg/dL (ref 65–99)

## 2020-05-29 LAB — CBC WITH DIFFERENTIAL/PLATELET
Hemoglobin: 13.4 g/dL (ref 11.7–15.5)
Lymphs Abs: 2395 cells/uL (ref 850–3900)
MPV: 11 fL (ref 7.5–12.5)
Neutrophils Relative %: 34.8 %
RBC: 4.3 10*6/uL (ref 3.80–5.10)

## 2020-06-02 ENCOUNTER — Telehealth: Payer: Self-pay

## 2020-06-02 NOTE — Progress Notes (Signed)
Please call patient: I have reviewed his/her lab results. All labs are normal except for thyroid test.  Please add T3 and Free T4.  Please complete her preop sheet. This abnormality should not keep her from getting her surgery. thanks

## 2020-06-02 NOTE — Telephone Encounter (Signed)
Pt called asking if her lab work, ekg, and pre-op form had been faxed to the office she is getting her surgery at. Please advise.

## 2020-06-03 ENCOUNTER — Encounter: Payer: Self-pay | Admitting: Family Medicine

## 2020-06-03 LAB — TEST AUTHORIZATION

## 2020-06-03 LAB — CBC WITH DIFFERENTIAL/PLATELET
Absolute Monocytes: 686 cells/uL (ref 200–950)
Basophils Absolute: 19 cells/uL (ref 0–200)
Eosinophils Absolute: 29 cells/uL (ref 15–500)
Eosinophils Relative: 0.6 %
MCH: 31.2 pg (ref 27.0–33.0)
MCHC: 33.7 g/dL (ref 32.0–36.0)
MCV: 92.6 fL (ref 80.0–100.0)
Monocytes Relative: 14.3 %
RDW: 12.4 % (ref 11.0–15.0)
Total Lymphocyte: 49.9 %
WBC: 4.8 10*3/uL (ref 3.8–10.8)

## 2020-06-03 LAB — COMPLETE METABOLIC PANEL WITH GFR
AG Ratio: 1.3 (calc) (ref 1.0–2.5)
ALT: 14 U/L (ref 6–29)
AST: 14 U/L (ref 10–30)
Calcium: 9.5 mg/dL (ref 8.6–10.2)
Chloride: 105 mmol/L (ref 98–110)
GFR, Est African American: 133 mL/min/{1.73_m2} (ref >=60)
Globulin: 3.2 g/dL (calc) (ref 1.9–3.7)
Potassium: 4 mmol/L (ref 3.5–5.3)
Sodium: 139 mmol/L (ref 135–146)
Total Bilirubin: 0.2 mg/dL (ref 0.2–1.2)
Total Protein: 7.5 g/dL (ref 6.1–8.1)

## 2020-06-03 LAB — TSH: TSH: 0.33 mIU/L — ABNORMAL LOW

## 2020-06-03 LAB — LIPID PANEL
Cholesterol: 160 mg/dL (ref <200)
HDL: 55 mg/dL (ref >=50)
LDL Cholesterol (Calc): 92 mg/dL (calc)
Non-HDL Cholesterol (Calc): 105 mg/dL (calc) (ref <130)
Triglycerides: 45 mg/dL (ref <150)

## 2020-06-03 LAB — HEPATITIS C ANTIBODY
Hepatitis C Ab: NONREACTIVE
SIGNAL TO CUT-OFF: 0.01 (ref <1.00)

## 2020-06-03 LAB — T3, FREE: T3, Free: 2.8 pg/mL (ref 2.3–4.2)

## 2020-06-03 LAB — T4, FREE: Free T4: 1.1 ng/dL (ref 0.8–1.8)

## 2020-06-03 LAB — HCG, SERUM, QUALITATIVE: Preg, Serum: NEGATIVE

## 2020-06-03 NOTE — Telephone Encounter (Signed)
Patient aware that all paperwork has been sent to cosmetic surgeon office

## 2020-06-11 ENCOUNTER — Ambulatory Visit: Payer: Medicaid Other | Admitting: Family Medicine

## 2020-07-30 ENCOUNTER — Telehealth: Payer: Medicaid Other | Admitting: Orthopedic Surgery

## 2020-07-30 DIAGNOSIS — N760 Acute vaginitis: Secondary | ICD-10-CM

## 2020-07-30 MED ORDER — FLUCONAZOLE 150 MG PO TABS: 150.0000 mg | ORAL_TABLET | Freq: Once | ORAL | 0 refills | Status: AC

## 2020-07-30 MED ORDER — METRONIDAZOLE 500 MG PO TABS: 500.0000 mg | ORAL_TABLET | Freq: Two times a day (BID) | ORAL | 0 refills | Status: AC

## 2020-07-30 NOTE — Addendum Note (Signed)
Addended by: Charma Igo on: 07/30/2020 04:42 PM   Modules accepted: Orders

## 2020-07-30 NOTE — Progress Notes (Signed)
We are sorry that you are not feeling well. Here is how we plan to help! Based on what you shared with me it looks like you: May have a yeast vaginosis  Vaginosis is an inflammation of the vagina that can result in discharge, itching and pain. The cause is usually a change in the normal balance of vaginal bacteria or an infection. Vaginosis can also result from reduced estrogen levels after menopause.  The most common causes of vaginosis are:   Bacterial vaginosis which results from an overgrowth of one on several organisms that are normally present in your vagina.   Yeast infections which are caused by a naturally occurring fungus called candida.   Vaginal atrophy (atrophic vaginosis) which results from the thinning of the vagina from reduced estrogen levels after menopause.   Trichomoniasis which is caused by a parasite and is commonly transmitted by sexual intercourse.  Factors that increase your risk of developing vaginosis include: . Medications, such as antibiotics and steroids . Uncontrolled diabetes . Use of hygiene products such as bubble bath, vaginal spray or vaginal deodorant . Douching . Wearing damp or tight-fitting clothing . Using an intrauterine device (IUD) for birth control . Hormonal changes, such as those associated with pregnancy, birth control pills or menopause . Sexual activity . Having a sexually transmitted infection  Your treatment plan is A single Diflucan (fluconazole) 150mg tablet once.  I have electronically sent this prescription into the pharmacy that you have chosen.  Be sure to take all of the medication as directed. Stop taking any medication if you develop a rash, tongue swelling or shortness of breath. Mothers who are breast feeding should consider pumping and discarding their breast milk while on these antibiotics. However, there is no consensus that infant exposure at these doses would be harmful.  Remember that medication creams can weaken latex  condoms. .   HOME CARE:  Good hygiene may prevent some types of vaginosis from recurring and may relieve some symptoms:  . Avoid baths, hot tubs and whirlpool spas. Rinse soap from your outer genital area after a shower, and dry the area well to prevent irritation. Don't use scented or harsh soaps, such as those with deodorant or antibacterial action. . Avoid irritants. These include scented tampons and pads. . Wipe from front to back after using the toilet. Doing so avoids spreading fecal bacteria to your vagina.  Other things that may help prevent vaginosis include:  . Don't douche. Your vagina doesn't require cleansing other than normal bathing. Repetitive douching disrupts the normal organisms that reside in the vagina and can actually increase your risk of vaginal infection. Douching won't clear up a vaginal infection. . Use a latex condom. Both female and female latex condoms may help you avoid infections spread by sexual contact. . Wear cotton underwear. Also wear pantyhose with a cotton crotch. If you feel comfortable without it, skip wearing underwear to bed. Yeast thrives in moist environments Your symptoms should improve in the next day or two.  GET HELP RIGHT AWAY IF:  . You have pain in your lower abdomen ( pelvic area or over your ovaries) . You develop nausea or vomiting . You develop a fever . Your discharge changes or worsens . You have persistent pain with intercourse . You develop shortness of breath, a rapid pulse, or you faint.  These symptoms could be signs of problems or infections that need to be evaluated by a medical provider now.  MAKE SURE YOU      Understand these instructions.  Will watch your condition.  Will get help right away if you are not doing well or get worse.  Your e-visit answers were reviewed by a board certified advanced clinical practitioner to complete your personal care plan. Depending upon the condition, your plan could have included  both over the counter or prescription medications. Please review your pharmacy choice to make sure that you have choses a pharmacy that is open for you to pick up any needed prescription, Your safety is important to us. If you have drug allergies check your prescription carefully.   You can use MyChart to ask questions about today's visit, request a non-urgent call back, or ask for a work or school excuse for 24 hours related to this e-Visit. If it has been greater than 24 hours you will need to follow up with your provider, or enter a new e-Visit to address those concerns. You will get a MyChart message within the next two days asking about your experience. I hope that your e-visit has been valuable and will speed your recovery.  Greater than 5 minutes, yet less than 10 minutes of time have been spent researching, coordinating and implementing care for this patient today.   

## 2020-08-19 DIAGNOSIS — Z03818 Encounter for observation for suspected exposure to other biological agents ruled out: Secondary | ICD-10-CM | POA: Diagnosis not present

## 2020-08-19 DIAGNOSIS — Z20822 Contact with and (suspected) exposure to covid-19: Secondary | ICD-10-CM | POA: Diagnosis not present

## 2020-09-09 ENCOUNTER — Telehealth: Payer: Self-pay

## 2020-09-09 ENCOUNTER — Encounter: Payer: Medicaid Other | Admitting: Family Medicine

## 2020-09-09 NOTE — Telephone Encounter (Signed)
Patient would like to be worked in for CPE sooner then the next available which is may 11 th after missing apt today 3/9 patient states she should not have to wait till may for her physical because she made her apt last year that was scheduled for today. I added to wait list as well.

## 2020-09-09 NOTE — Telephone Encounter (Signed)
Patient's CPE was cancelled today because she had it done for her pre op appt back in November. I have attempted to call multiple times today to explain this and sent a mychart message. I am unable to leave a message on her voicemail

## 2020-10-08 ENCOUNTER — Other Ambulatory Visit: Payer: Self-pay | Admitting: Family Medicine

## 2020-10-08 ENCOUNTER — Encounter: Payer: Self-pay | Admitting: Family Medicine

## 2020-11-11 ENCOUNTER — Encounter: Payer: Self-pay | Admitting: Family Medicine

## 2020-11-11 ENCOUNTER — Other Ambulatory Visit: Payer: Self-pay

## 2020-11-11 ENCOUNTER — Ambulatory Visit (INDEPENDENT_AMBULATORY_CARE_PROVIDER_SITE_OTHER): Payer: Medicaid Other | Admitting: Family Medicine

## 2020-11-11 ENCOUNTER — Other Ambulatory Visit (HOSPITAL_COMMUNITY)
Admission: RE | Admit: 2020-11-11 | Discharge: 2020-11-11 | Disposition: A | Discharge status: Home / Self Care | Payer: Medicaid Other | Source: Ambulatory Visit | Attending: Family Medicine | Admitting: Family Medicine

## 2020-11-11 VITALS — BP 118/76 | HR 84 | Temp 98.1°F | Ht 65.0 in | Wt 183.4 lb

## 2020-11-11 DIAGNOSIS — N898 Other specified noninflammatory disorders of vagina: Secondary | ICD-10-CM | POA: Insufficient documentation

## 2020-11-11 NOTE — Patient Instructions (Addendum)
Please return in November 2022 for your annual complete physical; please come fasting.  I will release your lab results to you on your MyChart account with further instructions. Please reply with any questions.  I will send in antibiotics if your test results show positive for BV.  If you have any questions or concerns, please don't hesitate to send me a message via MyChart or call the office at [phone]. Thank you for visiting with Joanne Santos today! It's our pleasure caring for you.

## 2020-11-11 NOTE — Progress Notes (Signed)
Subjective  CC:  Chief Complaint  Patient presents with  . STD Screening    HPI: Joanne Santos is a 30 y.o. female who presents to the office today to address the problems listed above in the chief complaint.  30 year old female here for STD screening.  She does have a vaginal discharge.  Has history of recurrent BV.  Has several day history of thin discharge with mild odor.  No itching.  No vaginal lesions.  She had a new sexual partner back in January but is no longer seeing him.  No known exposures to STDs.  Otherwise feeling well.  Of note, she did not have her breast augmentation done.  She got sick and canceled the surgery.   Assessment  1. Vaginal discharge      Plan   Vaginal discharge: Check for BV or other infection.  STD screening done.  Safe sex counseling given.  Follow up: Return in about 6 months (around 05/14/2021) for complete physical.  Visit date not found  Orders Placed This Encounter  Procedures  . HIV Antibody (routine testing w rflx)   No orders of the defined types were placed in this encounter.     I reviewed the patients updated PMH, FH, and SocHx.    Patient Active Problem List   Diagnosis Date Noted  . Presence of subdermal contraceptive  - nexplanon #3 08/2018 11/30/2017  . Genital herpes 10/07/2016  . Bacterial vaginosis 09/19/2016   Current Meds  Medication Sig  . etonogestrel (NEXPLANON) 68 MG IMPL implant Nexplanon 68 mg subdermal implant  Inject 1 implant by subcutaneous route.  . valACYclovir (VALTREX) 500 MG tablet TAKE 1 TABLET BY MOUTH 2 TIMES DAILY FOR 3 DAYS AS NEEDED FOR OUTBREAKS    Allergies: Patient has No Known Allergies. Family History: Patient family history includes Diabetes in her father and maternal grandmother; Early death in her mother; Hypertension in her father and sister; Stroke in her maternal grandmother. Social History:  Patient  reports that she has never smoked. She has never used smokeless tobacco.  She reports current alcohol use. She reports that she does not use drugs.  Review of Systems: Constitutional: Negative for fever malaise or anorexia Cardiovascular: negative for chest pain Respiratory: negative for SOB or persistent cough Gastrointestinal: negative for abdominal pain  Objective  Vitals: BP 118/76   Pulse 84   Temp 98.1 F (36.7 C) (Temporal)   Ht 5\' 5"  (1.651 m)   Wt 183 lb 6.4 oz (83.2 kg)   BMI 30.52 kg/m  General: no acute distress , A&Ox3, appears well    Commons side effects, risks, benefits, and alternatives for medications and treatment plan prescribed today were discussed, and the patient expressed understanding of the given instructions. Patient is instructed to call or message via MyChart if he/she has any questions or concerns regarding our treatment plan. No barriers to understanding were identified. We discussed Red Flag symptoms and signs in detail. Patient expressed understanding regarding what to do in case of urgent or emergency type symptoms.   Medication list was reconciled, printed and provided to the patient in AVS. Patient instructions and summary information was reviewed with the patient as documented in the AVS. This note was prepared with assistance of Dragon voice recognition software. Occasional wrong-word or sound-a-like substitutions may have occurred due to the inherent limitations of voice recognition software  This visit occurred during the SARS-CoV-2 public health emergency.  Safety protocols were in place, including screening questions prior  to the visit, additional usage of staff PPE, and extensive cleaning of exam room while observing appropriate contact time as indicated for disinfecting solutions.

## 2020-11-12 ENCOUNTER — Encounter: Payer: Self-pay | Admitting: Family Medicine

## 2020-11-12 LAB — CERVICOVAGINAL ANCILLARY ONLY
Bacterial Vaginitis (gardnerella): POSITIVE — AB
Candida Glabrata: NEGATIVE
Candida Vaginitis: POSITIVE — AB
Chlamydia: NEGATIVE
Comment: NEGATIVE
Comment: NEGATIVE
Comment: NEGATIVE
Comment: NEGATIVE
Comment: NEGATIVE
Comment: NORMAL
Neisseria Gonorrhea: NEGATIVE
Trichomonas: NEGATIVE

## 2020-11-12 LAB — HIV ANTIBODY (ROUTINE TESTING W REFLEX): HIV 1&2 Ab, 4th Generation: NONREACTIVE

## 2020-11-13 MED ORDER — CLINDAMYCIN HCL 300 MG PO CAPS: 300.0000 mg | ORAL_CAPSULE | Freq: Two times a day (BID) | ORAL | 0 refills | Status: AC

## 2020-11-13 MED ORDER — FLUCONAZOLE 150 MG PO TABS: ORAL_TABLET | ORAL | 0 refills | Status: DC

## 2020-11-13 NOTE — Addendum Note (Signed)
Addended by: Asencion Partridge on: 11/13/2020 12:10 PM   Modules accepted: Orders

## 2021-02-25 DIAGNOSIS — N898 Other specified noninflammatory disorders of vagina: Secondary | ICD-10-CM | POA: Diagnosis not present

## 2021-08-18 ENCOUNTER — Ambulatory Visit: Payer: Medicaid Other | Admitting: Family Medicine

## 2021-08-18 ENCOUNTER — Encounter: Payer: Self-pay | Admitting: Family Medicine

## 2021-08-18 ENCOUNTER — Other Ambulatory Visit: Payer: Self-pay

## 2021-08-18 VITALS — BP 119/62 | HR 70 | Temp 98.2°F | Ht 65.0 in | Wt 190.0 lb

## 2021-08-18 DIAGNOSIS — Z975 Presence of (intrauterine) contraceptive device: Secondary | ICD-10-CM | POA: Diagnosis not present

## 2021-08-18 DIAGNOSIS — Z3046 Encounter for surveillance of implantable subdermal contraceptive: Secondary | ICD-10-CM | POA: Diagnosis not present

## 2021-08-18 MED ORDER — LIDOCAINE-EPINEPHRINE 2 %-1:100000 IJ SOLN: 2.0000 mL | Freq: Once | INTRAMUSCULAR | Status: DC

## 2021-08-18 MED ORDER — LIDOCAINE HCL 2 % IJ SOLN: 20.0000 mL | Freq: Once | INTRAMUSCULAR | Status: DC

## 2021-08-18 NOTE — Patient Instructions (Signed)
Please return for an annual physical.   If you have any questions or concerns, please don't hesitate to send me a message via MyChart or call the office at [phone]. Thank you for visiting with [place] today! It's our pleasure caring for you.   Contraception Choices Contraception, also called birth control, refers to methods or devices that prevent pregnancy. Hormonal methods Contraceptive implant A contraceptive implant is a thin, plastic tube that contains a hormone that prevents pregnancy. It is different from an intrauterine device (IUD). It is inserted into the upper part of the arm by a health care provider. Implants can be effective for up to 3 years. Progestin-only injections Progestin-only injections are injections of progestin, a synthetic form of the hormone progesterone. They are given every 3 months by a health care provider. Birth control pills Birth control pills are pills that contain hormones that prevent pregnancy. They must be taken once a day, preferably at the same time each day. A prescription is needed to use this method of contraception. Birth control patch The birth control patch contains hormones that prevent pregnancy. It is placed on the skin and must be changed once a week for three weeks and removed on the fourth week. A prescription is needed to use this method of contraception. Vaginal ring A vaginal ring contains hormones that prevent pregnancy. It is placed in the vagina for three weeks and removed on the fourth week. After that, the process is repeated with a new ring. A prescription is needed to use this method of contraception. Emergency contraceptive Emergency contraceptives prevent pregnancy after unprotected sex. They come in pill form and can be taken up to 5 days after sex. They work best the sooner they are taken after having sex. Most emergency contraceptives are available without a prescription. This method should not be used as your only form of birth  control. Barrier methods Female condom A female condom is a thin sheath that is worn over the penis during sex. Condoms keep sperm from going inside a woman's body. They can be used with a sperm-killing substance (spermicide) to increase their effectiveness. They should be thrown away after one use. Female condom A female condom is a soft, loose-fitting sheath that is put into the vagina before sex. The condom keeps sperm from going inside a woman's body. They should be thrown away after one use. Diaphragm A diaphragm is a soft, dome-shaped barrier. It is inserted into the vagina before sex, along with a spermicide. The diaphragm blocks sperm from entering the uterus, and the spermicide kills sperm. A diaphragm should be left in the vagina for 6-8 hours after sex and removed within 24 hours. A diaphragm is prescribed and fitted by a health care provider. A diaphragm should be replaced every 1-2 years, after giving birth, after gaining more than 15 lb (6.8 kg), and after pelvic surgery. Cervical cap A cervical cap is a round, soft latex or plastic cup that fits over the cervix. It is inserted into the vagina before sex, along with spermicide. It blocks sperm from entering the uterus. The cap should be left in place for 6-8 hours after sex and removed within 48 hours. A cervical cap must be prescribed and fitted by a health care provider. It should be replaced every 2 years. Sponge A sponge is a soft, circular piece of polyurethane foam with spermicide in it. The sponge helps block sperm from entering the uterus, and the spermicide kills sperm. To use it, you make it  wet and then insert it into the vagina. It should be inserted before sex, left in for at least 6 hours after sex, and removed and thrown away within 30 hours. Spermicides Spermicides are chemicals that kill or block sperm from entering the cervix and uterus. They can come as a cream, jelly, suppository, foam, or tablet. A spermicide should be  inserted into the vagina with an applicator at least 10-15 minutes before sex to allow time for it to work. The process must be repeated every time you have sex. Spermicides do not require a prescription. Intrauterine contraception Intrauterine device (IUD) An IUD is a T-shaped device that is put in a woman's uterus. There are two types: Hormone IUD.This type contains progestin, a synthetic form of the hormone progesterone. This type can stay in place for 3-5 years. Copper IUD.This type is wrapped in copper wire. It can stay in place for 10 years. Permanent methods of contraception Female tubal ligation In this method, a woman's fallopian tubes are sealed, tied, or blocked during surgery to prevent eggs from traveling to the uterus. Hysteroscopic sterilization In this method, a small, flexible insert is placed into each fallopian tube. The inserts cause scar tissue to form in the fallopian tubes and block them, so sperm cannot reach an egg. The procedure takes about 3 months to be effective. Another form of birth control must be used during those 3 months. Female sterilization This is a procedure to tie off the tubes that carry sperm (vasectomy). After the procedure, the man can still ejaculate fluid (semen). Another form of birth control must be used for 3 months after the procedure. Natural planning methods Natural family planning In this method, a couple does not have sex on days when the woman could become pregnant. Calendar method In this method, the woman keeps track of the length of each menstrual cycle, identifies the days when pregnancy can happen, and does not have sex on those days. Ovulation method In this method, a couple avoids sex during ovulation. Symptothermal method This method involves not having sex during ovulation. The woman typically checks for ovulation by watching changes in her temperature and in the consistency of cervical mucus. Post-ovulation method In this method, a  couple waits to have sex until after ovulation. Where to find more information Centers for Disease Control and Prevention: [employer] Summary Contraception, also called birth control, refers to methods or devices that prevent pregnancy. Hormonal methods of contraception include implants, injections, pills, patches, vaginal rings, and emergency contraceptives. Barrier methods of contraception can include female condoms, female condoms, diaphragms, cervical caps, sponges, and spermicides. There are two types of IUDs (intrauterine devices). An IUD can be put in a woman's uterus to prevent pregnancy for 3-5 years. Permanent sterilization can be done through a procedure for males and females. Natural family planning methods involve nothaving sex on days when the woman could become pregnant. This information is not intended to replace advice given to you by your health care provider. Make sure you discuss any questions you have with your health care provider. Document Revised: 11/25/2019 Document Reviewed: 11/25/2019 Elsevier Patient Education  2022 [employer].

## 2021-08-18 NOTE — Progress Notes (Signed)
Subjective  CC:  Chief Complaint  Patient presents with   Contraception    Removal of nexplanon.     HPI: Joanne Santos is a 31 y.o. female who presents to the office today to address the problems listed above in the chief complaint. 67 yo with one child not currently in a regular relationship who does not want more children presents for nexplanon removal. It  was placed in 08/2018. She has intermittent menses now. No problems with it. This was her 3rd nexplanon. She does not want another form of birth control now. She will use condoms if she becomes sexually active again. She feels strongly about this right now.   Assessment  1. Encounter for Nexplanon removal   2. Presence of subdermal contraceptive  - nexplanon #3 08/2018      Plan  Nexplanon removal:  no complications. Routine post procedure care discussed.  Contraceptive options given in AVS. To return if desires. Has used depo and nexplanon.  Follow up: return for cpe  11/17/2021  No orders of the defined types were placed in this encounter.  No orders of the defined types were placed in this encounter.     I reviewed the patients updated PMH, FH, and SocHx.    Patient Active Problem List   Diagnosis Date Noted   Genital herpes 10/07/2016   Bacterial vaginosis 09/19/2016   Current Meds  Medication Sig   [DISCONTINUED] etonogestrel (NEXPLANON) 68 MG IMPL implant Nexplanon 68 mg subdermal implant  Inject 1 implant by subcutaneous route.    Allergies: Patient has No Known Allergies. Family History: Patient family history includes Diabetes in her father and maternal grandmother; Early death in her mother; Hypertension in her father and sister; Stroke in her maternal grandmother. Social History:  Patient  reports that she has never smoked. She has never used smokeless tobacco. She reports current alcohol use. She reports that she does not use drugs.  Review of Systems: Constitutional: Negative for fever malaise  or anorexia Cardiovascular: negative for chest pain Respiratory: negative for SOB or persistent cough Gastrointestinal: negative for abdominal pain  Objective  Vitals: BP 119/62    Pulse 70    Temp 98.2 F (36.8 C) (Temporal)    Ht 5\' 5"  (1.651 m)    Wt 190 lb (86.2 kg)    LMP 07/19/2021 (Exact Date)    SpO2 97%    BMI 31.62 kg/m  General: no acute distress , A&Ox3 Left inner upper arm: easily palpable nexplanon in place  Skin:  Warm, no rashes  Nexplanon Removal Procedure Note:  Indication for Removal: Abnormal bleeding / Desires pregnancy / Due for Removal / Side effects / Other Location / Side:left Pre-Procedure Diagnosis: nexplanon removal Post-Procedure Diagnosis: Nexplanon removed Informed consent: Procedure, alternate treatment options, risks, and benefits were thoroughly explained to the patient and informed consent was obtained before the procedure started. Risks of the procedure include (but are not limited to) bleeding, infection, difficulty with removal, scarring, and nerve damage. There may be bruising at the site of incision and down the arm. Equipment for procedure available.  The appropriate universal time-out was taken. The procedure was confirmed by patient and team. The position correct for the procedure.  The patient is placed in the supine position. Aseptic conditions aree maintained. The rod is located by palpation. The area is cleaned with antiseptic. 1-2cc of 1% lidocaine with epinephrine is injected just underneath the end of the implant closest to the elbow. After firmly pressing  down on the end of the implant closer to the axilla a 2-3 mm incision is made with a scalpel. The rod is pushed to the incision site and grasped with a mosquito forceps and gently removed. Blunt dissection was not needed. The patient tolerated the procedure well. The 4 cm rod was removed in its entirety. The incision was dressed with a small adhesive bandage closure and a pressure dressing  was applied.  An alternate plan for contraception was discussed. The patient would like to use  abstinence or condoms  for her contraception. She was given a detailed instruction sheet about her desired method of contraception.   Commons side effects, risks, benefits, and alternatives for medications and treatment plan prescribed today were discussed, and the patient expressed understanding of the given instructions. Patient is instructed to call or message via MyChart if he/she has any questions or concerns regarding our treatment plan. No barriers to understanding were identified. We discussed Red Flag symptoms and signs in detail. Patient expressed understanding regarding what to do in case of urgent or emergency type symptoms.  Medication list was reconciled, printed and provided to the patient in AVS. Patient instructions and summary information was reviewed with the patient as documented in the AVS. This note was prepared with assistance of Dragon voice recognition software. Occasional wrong-word or sound-a-like substitutions may have occurred due to the inherent limitations of voice recognition software  This visit occurred during the SARS-CoV-2 public health emergency.  Safety protocols were in place, including screening questions prior to the visit, additional usage of staff PPE, and extensive cleaning of exam room while observing appropriate contact time as indicated for disinfecting solutions.

## 2021-09-04 DIAGNOSIS — Z113 Encounter for screening for infections with a predominantly sexual mode of transmission: Secondary | ICD-10-CM | POA: Diagnosis not present

## 2021-09-04 DIAGNOSIS — N760 Acute vaginitis: Secondary | ICD-10-CM | POA: Diagnosis not present

## 2021-11-17 ENCOUNTER — Encounter: Payer: Medicaid Other | Admitting: Family Medicine

## 2022-01-28 ENCOUNTER — Ambulatory Visit
Admission: EM | Admit: 2022-01-28 | Discharge: 2022-01-28 | Disposition: A | Discharge status: Home / Self Care | Payer: Medicaid Other | Attending: Physician Assistant | Admitting: Physician Assistant

## 2022-01-28 DIAGNOSIS — J039 Acute tonsillitis, unspecified: Secondary | ICD-10-CM

## 2022-01-28 DIAGNOSIS — J029 Acute pharyngitis, unspecified: Secondary | ICD-10-CM | POA: Diagnosis not present

## 2022-01-28 LAB — POCT MONO SCREEN (KUC): Mono, POC: NEGATIVE

## 2022-01-28 LAB — POCT RAPID STREP A (OFFICE): Rapid Strep A Screen: NEGATIVE

## 2022-01-28 MED ORDER — AMOXICILLIN-POT CLAVULANATE 875-125 MG PO TABS: 1.0000 | ORAL_TABLET | Freq: Two times a day (BID) | ORAL | 0 refills | Status: DC

## 2022-01-28 NOTE — ED Provider Notes (Signed)
[facility]    CSN: [id number] Arrival date & time: 01/28/22  1158      History   Chief Complaint Chief Complaint  Patient presents with   Generalized Body Aches    HPI Joanne Santos is a 31 y.o. female.   Patient presents today with a 3-day history of sore throat.  Reports associated fever, decreased appetite, nausea/vomiting, fatigue, malaise.  She denies any congestion, cough, shortness of breath, chest pain.  She does report a patient who is tested positive for COVID but denies additional known sick contacts.  She has not been taking any over-the-counter medication has been difficult to keep this down.  She has had COVID vaccines.  Has not had COVID in the past.  Denies history of strep pharyngitis.  Denies any recent steroid or antibiotic use.  She is having difficulty with daily duties as result of symptoms.  She is confident that she is not pregnant.    Past Medical History:  Diagnosis Date   BV (bacterial vaginosis)    Febrile seizure [facility])     Patient Active Problem List   Diagnosis Date Noted   Genital herpes 10/07/2016   Bacterial vaginosis 09/19/2016    Past Surgical History:  Procedure Laterality Date   CESAREAN SECTION  2016    OB History   No obstetric history on file.      Home Medications    Prior to Admission medications   Medication Sig Start Date End Date Taking? Authorizing Provider  amoxicillin-clavulanate (AUGMENTIN) 875-125 MG tablet Take 1 tablet by mouth every 12 (twelve) hours. 01/28/22  Yes Darshana Curnutt, Denny Peon K, PA-C  valACYclovir (VALTREX) 500 MG tablet TAKE 1 TABLET BY MOUTH 2 TIMES DAILY FOR 3 DAYS AS NEEDED FOR OUTBREAKS Patient not taking: Reported on 08/18/2021 10/08/20   Willow Ora, MD    Family History Family History  Problem Relation Age of Onset   Early death Mother    Diabetes Father    Hypertension Father    Diabetes Maternal Grandmother    Stroke Maternal Grandmother    Hypertension Sister     Social  History Social History   Tobacco Use   Smoking status: Never   Smokeless tobacco: Never  Vaping Use   Vaping Use: Former  Substance Use Topics   Alcohol use: Yes    Comment: weekly   Drug use: No     Allergies   Patient has no known allergies.   Review of Systems Review of Systems  Constitutional:  Positive for activity change, fatigue and fever. Negative for appetite change.  HENT:  Positive for sore throat and trouble swallowing. Negative for congestion, sinus pressure and sneezing.   Respiratory:  Negative for cough and shortness of breath.   Cardiovascular:  Negative for chest pain.  Gastrointestinal:  Positive for nausea and vomiting. Negative for abdominal pain and diarrhea.  Neurological:  Positive for headaches. Negative for dizziness and light-headedness.     Physical Exam Triage Vital Signs ED Triage Vitals [01/28/22 1219]  Enc Vitals Group     BP (!) 146/75     Pulse Rate 100     Resp 18     Temp 99.8 F (37.7 C)     Temp Source Oral     SpO2 98 %     Weight      Height      Head Circumference      Peak Flow      Pain Score 0  Pain Loc      Pain Edu?      Excl. in GC?    No data found.  Updated Vital Signs BP (!) 146/75 (BP Location: Left Arm)   Pulse 100   Temp 99.8 F (37.7 C) (Oral)   Resp 18   SpO2 98%   Visual Acuity Right Eye Distance:   Left Eye Distance:   Bilateral Distance:    Right Eye Near:   Left Eye Near:    Bilateral Near:     Physical Exam Vitals reviewed.  Constitutional:      General: She is awake. She is not in acute distress.    Appearance: Normal appearance. She is well-developed. She is not ill-appearing.     Comments: Very pleasant female appears stated age in no acute distress sitting comfortably in exam room holding emesis bag  HENT:     Head: Normocephalic and atraumatic.     Right Ear: Tympanic membrane, ear canal and external ear normal. Tympanic membrane is not erythematous or bulging.     Left  Ear: Tympanic membrane, ear canal and external ear normal. Tympanic membrane is not erythematous or bulging.     Nose:     Right Sinus: No maxillary sinus tenderness or frontal sinus tenderness.     Left Sinus: No maxillary sinus tenderness or frontal sinus tenderness.     Mouth/Throat:     Pharynx: Uvula midline. Posterior oropharyngeal erythema present. No oropharyngeal exudate.     Tonsils: Tonsillar exudate present. 2+ on the right. 2+ on the left.  Cardiovascular:     Rate and Rhythm: Normal rate and regular rhythm.     Heart sounds: Normal heart sounds, S1 normal and S2 normal. No murmur heard. Pulmonary:     Effort: Pulmonary effort is normal.     Breath sounds: Normal breath sounds. No wheezing, rhonchi or rales.     Comments: Clear to auscultation bilaterally Lymphadenopathy:     Head:     Right side of head: No submental, submandibular or tonsillar adenopathy.     Left side of head: No submental, submandibular or tonsillar adenopathy.     Cervical: No cervical adenopathy.  Psychiatric:        Behavior: Behavior is cooperative.      UC Treatments / Results  Labs (all labs ordered are listed, but only abnormal results are displayed) Labs Reviewed  POCT RAPID STREP A (OFFICE)  POCT MONO SCREEN ([facility])    EKG   Radiology No results found.  Procedures Procedures (including critical care time)  Medications Ordered in UC Medications - No data to display  Initial Impression / Assessment and Plan / UC Course  I have reviewed the triage vital signs and the nursing notes.  Pertinent labs & imaging results that were available during my care of the patient were reviewed by me and considered in my medical decision making (see chart for details).     Centor score of 3.  Rapid strep was negative in clinic today.  Mono was negative as well.  Given clinical presentation including exudative tonsillitis will cover for bacterial infection with Augmentin twice daily for 10 days.   Discussed that she should gargle with warm salt water and alternate Tylenol ibuprofen for fever and pain.  She is to dispose of her toothbrush few days after starting medication.  She was provided work excuse note.  Discussed that if she has any worsening symptoms including inability to swallow, swelling of her throat, nausea/vomiting,  fever not responding to medication she needs to go to the emergency room.  Strict return precautions given.  Work excuse note provided.  Final Clinical Impressions(s) / UC Diagnoses   Final diagnoses:  Exudative tonsillitis  Sore throat     Discharge Instructions      Your strep and monotest were negative in clinic today.  Given the appearance of your tonsils we are going to treat for an infection.  Start Augmentin twice daily for 10 days.  Take this with food.  Gargle with warm salt water and use Tylenol ibuprofen for fever and pain.  If you have any worsening symptoms including inability to swallow, swelling of your throat, shortness of breath, fever not responding to medication, nausea/vomiting interfere with oral intake you need to go to the emergency room.  Make sure to throw away your toothbrush a few days after starting medication to prevent reinfection.  Avoid close contact with others for several days after starting antibiotics.     ED Prescriptions     Medication Sig Dispense Auth. Provider   amoxicillin-clavulanate (AUGMENTIN) 875-125 MG tablet Take 1 tablet by mouth every 12 (twelve) hours. 20 tablet Jenilee Franey, Noberto Retort, PA-C      PDMP not reviewed this encounter.   Jeani Hawking, PA-C 01/28/22 1323

## 2022-01-28 NOTE — Discharge Instructions (Addendum)
Your strep and monotest were negative in clinic today.  Given the appearance of your tonsils we are going to treat for an infection.  Start Augmentin twice daily for 10 days.  Take this with food.  Gargle with warm salt water and use Tylenol ibuprofen for fever and pain.  If you have any worsening symptoms including inability to swallow, swelling of your throat, shortness of breath, fever not responding to medication, nausea/vomiting interfere with oral intake you need to go to the emergency room.  Make sure to throw away your toothbrush a few days after starting medication to prevent reinfection.  Avoid close contact with others for several days after starting antibiotics.

## 2022-01-28 NOTE — ED Triage Notes (Signed)
Pt c/o sore throat, body aches, feeling "whoozy," headache, nausea vomiting,   Denies nasal congestion,   Onset ~ yesterday. Reports covid exposure.

## 2022-01-30 ENCOUNTER — Encounter (HOSPITAL_BASED_OUTPATIENT_CLINIC_OR_DEPARTMENT_OTHER): Payer: Self-pay | Admitting: Emergency Medicine

## 2022-01-30 ENCOUNTER — Emergency Department (HOSPITAL_BASED_OUTPATIENT_CLINIC_OR_DEPARTMENT_OTHER)
Admission: EM | Admit: 2022-01-30 | Discharge: 2022-01-30 | Disposition: A | Discharge status: Home / Self Care | Payer: Medicaid Other | Attending: Emergency Medicine | Admitting: Emergency Medicine

## 2022-01-30 ENCOUNTER — Other Ambulatory Visit: Payer: Self-pay

## 2022-01-30 DIAGNOSIS — R509 Fever, unspecified: Secondary | ICD-10-CM | POA: Diagnosis not present

## 2022-01-30 DIAGNOSIS — R112 Nausea with vomiting, unspecified: Secondary | ICD-10-CM | POA: Diagnosis not present

## 2022-01-30 DIAGNOSIS — J029 Acute pharyngitis, unspecified: Secondary | ICD-10-CM | POA: Diagnosis not present

## 2022-01-30 DIAGNOSIS — J351 Hypertrophy of tonsils: Secondary | ICD-10-CM | POA: Diagnosis not present

## 2022-01-30 DIAGNOSIS — J358 Other chronic diseases of tonsils and adenoids: Secondary | ICD-10-CM

## 2022-01-30 MED ORDER — ONDANSETRON HCL 4 MG/2ML IJ SOLN
4.0000 mg | Freq: Once | INTRAMUSCULAR | Status: AC
  Administered 2022-01-30: 4 mg via INTRAVENOUS
  Filled 2022-01-30: qty 2

## 2022-01-30 MED ORDER — ONDANSETRON HCL 4 MG PO TABS: 4.0000 mg | ORAL_TABLET | Freq: Three times a day (TID) | ORAL | 0 refills | Status: DC | PRN

## 2022-01-30 MED ORDER — SODIUM CHLORIDE 0.9 % IV BOLUS: 1000.0000 mL | Freq: Once | INTRAVENOUS | Status: DC

## 2022-01-30 MED ORDER — AMOXICILLIN-POT CLAVULANATE 875-125 MG PO TABS
1.0000 | ORAL_TABLET | Freq: Once | ORAL | Status: AC
Start: 2022-01-30 — End: 2022-01-30
  Administered 2022-01-30: 1 via ORAL
  Filled 2022-01-30: qty 1

## 2022-01-30 NOTE — ED Provider Notes (Signed)
[facility] EMERGENCY DEPARTMENT Provider Note   CSN: [id number] Arrival date & time: 01/30/22  3329     History  Chief Complaint  Patient presents with   Sore Throat    Joanne Santos is a 31 y.o. female.  The patient presents to the hospital with chief complaint of sore throat.  The patient states that she has had a sore throat for the past 5 days.  She was seen at urgent care on Friday where strep and monotest were completed.  Both test returned as negative.  The patient was treated with Augmentin based on symptoms and presentation.  She states that she has taken 3 doses of the Augmentin.  She states that she vomits approximately 2 to 3 hours after taking the medication.  She is concerned that she may not be getting better.  She has a low-grade fever at triage today and has had no medication this morning.  The patient denies shortness of breath, cough, body aches, chills.  She endorses sore throat, nausea, emesis, fever  HPI     Home Medications Prior to Admission medications   Medication Sig Start Date End Date Taking? Authorizing Provider  amoxicillin-clavulanate (AUGMENTIN) 875-125 MG tablet Take 1 tablet by mouth every 12 (twelve) hours. 01/28/22   Raspet, Noberto Retort, PA-C  valACYclovir (VALTREX) 500 MG tablet TAKE 1 TABLET BY MOUTH 2 TIMES DAILY FOR 3 DAYS AS NEEDED FOR OUTBREAKS Patient not taking: Reported on 08/18/2021 10/08/20   Willow Ora, MD      Allergies    Patient has no known allergies.    Review of Systems   Review of Systems  Constitutional:  Positive for fever.  HENT:  Positive for sore throat.   Respiratory:  Negative for cough and shortness of breath.   Cardiovascular:  Negative for chest pain.  Gastrointestinal:  Positive for nausea and vomiting. Negative for abdominal pain.    Physical Exam Updated Vital Signs BP 115/80   Pulse 92   Temp 99.3 F (37.4 C) (Oral)   Resp 18   Ht 5\' 5"  (1.651 m)   Wt 81.6 kg   LMP 01/25/2022   SpO2 100%    BMI 29.95 kg/m  Physical Exam Vitals and nursing note reviewed.  Constitutional:      Appearance: She is normal weight.  HENT:     Head: Normocephalic and atraumatic.     Mouth/Throat:     Mouth: Mucous membranes are moist.     Pharynx: Uvula midline. Posterior oropharyngeal erythema present.     Tonsils: Tonsillar exudate present. 0 on the right. 0 on the left.  Eyes:     Conjunctiva/sclera: Conjunctivae normal.  Cardiovascular:     Rate and Rhythm: Normal rate and regular rhythm.     Heart sounds: Normal heart sounds.  Pulmonary:     Effort: Pulmonary effort is normal.     Breath sounds: Normal breath sounds.  Abdominal:     Palpations: Abdomen is soft.     Tenderness: There is no abdominal tenderness.  Musculoskeletal:     Cervical back: Normal range of motion and neck supple.  Lymphadenopathy:     Cervical: No cervical adenopathy.  Skin:    General: Skin is warm and dry.     Capillary Refill: Capillary refill takes less than 2 seconds.  Neurological:     Mental Status: She is alert and oriented to person, place, and time.     ED Results / Procedures / Treatments  Labs (all labs ordered are listed, but only abnormal results are displayed) Labs Reviewed - No data to display  EKG None  Radiology No results found.  Procedures Procedures    Medications Ordered in ED Medications  sodium chloride 0.9 % bolus 1,000 mL (has no administration in time range)  ondansetron (ZOFRAN) injection 4 mg (has no administration in time range)    ED Course/ Medical Decision Making/ A&P                           Medical Decision Making Risk Prescription drug management.   The patient presents today with a chief complaint of sore throat.  Differential includes strep pharyngitis, upper respiratory infection, peritonsillar abscess, and others  The patient was seen in urgent care 2 days ago.  I reviewed the patient's chart from that visit.  Rapid strep testing and mono  testing were negative.  She was treated for sore throat with tonsillar exudate with Augmentin.  The patient states she has been vomiting after taking the Augmentin.  I believe the patient has not had enough antibiotic therapy to consider this a failure of treatment at this time.  She states that she has been vomiting 2 to 3 hours after taking the medication which means medication should be in her system by that time.  She was not prescribed Zofran at those visits.  I ordered the patient Zofran for nausea and a normal saline bolus for rehydration.  I also ordered the patient a dose of Augmentin to be taken while here.  Upon reassessment the patient was feeling somewhat better.  I considered ordering a repeat strep test but saw no utility at this time.  Even if the test was negative I would continue the antibiotic therapy.  I considered a CBC but the patient's vitals are all within normal limits and once again doubt this would change therapy.  The patient does not appear to be septic in any criteria and does not appear toxic.  No clinical signs of peritonsillar abscess  Plan to discharge patient home with Zofran prescription to be taken prior to taking her Augmentin.  I believe the patient should continue to take the Augmentin and complete the course.  The patient has a primary care provider.  Recommend follow-up with primary care in the next 2 to 3 days.  If she develops any life-threatening conditions she will return to the emergency department for reevaluation        Final Clinical Impression(s) / ED Diagnoses Final diagnoses:  None    Rx / DC Orders ED Discharge Orders     None         Pamala Duffel 01/30/22 1124    Milagros Loll, MD 01/30/22 1524

## 2022-01-30 NOTE — Discharge Instructions (Addendum)
You were seen today for your sore throat. You should keep taking the Augmentin as prescribed. I have added a prescription for nausea medication to be taken prior to taking the antibiotic.  Please continue to hydrate as you are able at home.  I suggest trying popsicles or other cold drinks and food.  If you become unable to tolerate any oral liquids you should return to the emergency department for reevaluation.  Please follow-up with your primary care provider in the next few days for reevaluation.

## 2022-01-30 NOTE — ED Triage Notes (Signed)
Pt arrives pov, steady gait, c/o sore throat x 5 days. Tx at UC, rx for abx. Difficulty swallowing, neck tenderness. Also reports vaginal d/c and odor

## 2022-01-30 NOTE — ED Notes (Addendum)
Patient is currently taking amoxicillin. She states that she has  had sore throat since wednesday .  Alert  x4 States that she cannt keep any food down . Breathing is unlabored

## 2022-02-02 ENCOUNTER — Other Ambulatory Visit (HOSPITAL_COMMUNITY)
Admission: RE | Admit: 2022-02-02 | Discharge: 2022-02-02 | Disposition: A | Discharge status: Home / Self Care | Payer: Medicaid Other | Source: Ambulatory Visit | Attending: Family Medicine | Admitting: Family Medicine

## 2022-02-02 ENCOUNTER — Encounter: Payer: Self-pay | Admitting: Family Medicine

## 2022-02-02 ENCOUNTER — Ambulatory Visit: Payer: Medicaid Other | Admitting: Family Medicine

## 2022-02-02 VITALS — BP 100/60 | HR 62 | Temp 98.3°F | Ht 65.0 in | Wt 178.8 lb

## 2022-02-02 DIAGNOSIS — Z202 Contact with and (suspected) exposure to infections with a predominantly sexual mode of transmission: Secondary | ICD-10-CM | POA: Insufficient documentation

## 2022-02-02 DIAGNOSIS — N760 Acute vaginitis: Secondary | ICD-10-CM

## 2022-02-02 DIAGNOSIS — J029 Acute pharyngitis, unspecified: Secondary | ICD-10-CM

## 2022-02-02 NOTE — Progress Notes (Signed)
Subjective  CC:  Chief Complaint  Patient presents with   Vaginal Discharge    Was seen in Ed for strep but is feeling much better. Pt stated that she has been having a foul Vag smell since 01/28/2022. No itching, or burning. Discharge is white but liquid.     HPI: Joanne Santos is a 31 y.o. female who presents to the office today to address the problems listed above in the chief complaint. Patient presents for evaluation of an abnormal vaginal discharge: she describes  white, thin, and malodorous without pain, fevers or vaginal sores/lesions. Sexually transmitted infection risk: Possible STD exposure - new partner x 2 months w/o condoms.  She declines serology testing and HIV testing today but will get at her upcoming physical appt. She denies urinary sxs. She uses None for birth control.  F/u ST: see uc and ED visits. Neg strep and mono testing done early and not repeated. Treated empirically with augmentin but slow to resolve. Zofran due to drug induced nausea. Now improving. Day 7.  I reviewed the patients updated PMH, FH, and SocHx.    Patient Active Problem List   Diagnosis Date Noted   Genital herpes 10/07/2016   Bacterial vaginosis 09/19/2016   Current Meds  Medication Sig   amoxicillin-clavulanate (AUGMENTIN) 875-125 MG tablet Take 1 tablet by mouth every 12 (twelve) hours.   ondansetron (ZOFRAN) 4 MG tablet Take 1 tablet (4 mg total) by mouth every 8 (eight) hours as needed for nausea or vomiting.    Allergies: Patient has No Known Allergies. Family History: Patient family history includes Diabetes in her father and maternal grandmother; Early death in her mother; Hypertension in her father and sister; Stroke in her maternal grandmother. Social History:  Patient  reports that she has never smoked. She has never used smokeless tobacco. She reports current alcohol use. She reports that she does not use drugs.  Review of Systems: Constitutional: Negative for fever  malaise or anorexia Cardiovascular: negative for chest pain Respiratory: negative for SOB or persistent cough Gastrointestinal: negative for abdominal pain  Objective  Vitals: BP 100/60   Pulse 62   Temp 98.3 F (36.8 C)   Ht 5\' 5"  (1.651 m)   Wt 178 lb 12.8 oz (81.1 kg)   LMP 01/25/2022   SpO2 98%   BMI 29.75 kg/m  General: no acute distress , A&Ox3 HEENT: +3 tonsils w/o erythema or exudate Cardiovascular:  RRR without murmur or gallop.  Gastrointestinal: soft, flat abdomen, normal active bowel sounds, nontender Skin:  Warm, no rashes  Assessment  1. Acute vaginitis   2. Viral pharyngitis   3. Possible exposure to STD      Plan  vaginitis:  swab for diagnosis: h/o recurrent BV; at risk for yeast given recent abx use, at risk for STD given unprotected intercourse. Treat once results back. No red flags. No sxs of PID> LMP was last week, normal and on time.  Safe sex counseling done. Pt declines birth control. Condoms advised always.  Likely viral pharyngitis. D/c augmentin and zofran. Improved.  MDM: reviewed 2 separate UC and ED encounters. Interpreted results and findings; ordered 3+ testing today. Will prescribe appropriate medications. Counseling done.   Follow up: cpe aug 25   Commons side effects, risks, benefits, and alternatives for medications and treatment plan prescribed today were discussed, and the patient expressed understanding of the given instructions. Patient is instructed to call or message via MyChart if he/she has any questions or  concerns regarding our treatment plan. No barriers to understanding were identified. We discussed Red Flag symptoms and signs in detail. Patient expressed understanding regarding what to do in case of urgent or emergency type symptoms.  Medication list was reconciled, printed and provided to the patient in AVS. Patient instructions and summary information was reviewed with the patient as documented in the AVS. This note was prepared  with assistance of Dragon voice recognition software. Occasional wrong-word or sound-a-like substitutions may have occurred due to the inherent limitations of voice recognition software  No orders of the defined types were placed in this encounter.  No orders of the defined types were placed in this encounter.

## 2022-02-03 ENCOUNTER — Other Ambulatory Visit: Payer: Self-pay | Admitting: Family Medicine

## 2022-02-03 ENCOUNTER — Encounter: Payer: Self-pay | Admitting: Family Medicine

## 2022-02-03 ENCOUNTER — Telehealth: Payer: Self-pay | Admitting: Family Medicine

## 2022-02-03 LAB — CERVICOVAGINAL ANCILLARY ONLY
Bacterial Vaginitis (gardnerella): POSITIVE — AB
Candida Glabrata: NEGATIVE
Candida Vaginitis: NEGATIVE
Chlamydia: NEGATIVE
Comment: NEGATIVE
Comment: NEGATIVE
Comment: NEGATIVE
Comment: NEGATIVE
Comment: NEGATIVE
Comment: NORMAL
Neisseria Gonorrhea: NEGATIVE
Trichomonas: NEGATIVE

## 2022-02-03 MED ORDER — METRONIDAZOLE 500 MG PO TABS: 500.0000 mg | ORAL_TABLET | Freq: Two times a day (BID) | ORAL | 0 refills | Status: DC

## 2022-02-03 MED ORDER — METRONIDAZOLE 0.75 % EX CREA: TOPICAL_CREAM | Freq: Two times a day (BID) | CUTANEOUS | 0 refills | Status: AC

## 2022-02-03 NOTE — Telephone Encounter (Signed)
Patient requests the RX for be changed for metroNIDAZOLE (FLAGYL) 500 MG tablet  to a cream.   Patient states she does not like the tablet form.  [id number] - [place], [place] - [address] Phone:  [phone]  Fax:  [phone]

## 2022-02-03 NOTE — Addendum Note (Signed)
Addended by: Asencion Partridge on: 02/03/2022 03:27 PM   Modules accepted: Orders

## 2022-02-25 ENCOUNTER — Other Ambulatory Visit (HOSPITAL_COMMUNITY)
Admission: RE | Admit: 2022-02-25 | Discharge: 2022-02-25 | Disposition: A | Discharge status: Home / Self Care | Payer: Medicaid Other | Source: Ambulatory Visit | Attending: Family Medicine | Admitting: Family Medicine

## 2022-02-25 ENCOUNTER — Ambulatory Visit (INDEPENDENT_AMBULATORY_CARE_PROVIDER_SITE_OTHER): Payer: Medicaid Other | Admitting: Family Medicine

## 2022-02-25 ENCOUNTER — Encounter: Payer: Self-pay | Admitting: Family Medicine

## 2022-02-25 VITALS — BP 102/54 | HR 63 | Temp 98.3°F | Ht 65.0 in | Wt 181.4 lb

## 2022-02-25 DIAGNOSIS — N760 Acute vaginitis: Secondary | ICD-10-CM

## 2022-02-25 DIAGNOSIS — Z124 Encounter for screening for malignant neoplasm of cervix: Secondary | ICD-10-CM | POA: Diagnosis not present

## 2022-02-25 DIAGNOSIS — N926 Irregular menstruation, unspecified: Secondary | ICD-10-CM

## 2022-02-25 DIAGNOSIS — M79644 Pain in right finger(s): Secondary | ICD-10-CM | POA: Diagnosis not present

## 2022-02-25 DIAGNOSIS — Z0000 Encounter for general adult medical examination without abnormal findings: Secondary | ICD-10-CM

## 2022-02-25 DIAGNOSIS — Z3009 Encounter for other general counseling and advice on contraception: Secondary | ICD-10-CM

## 2022-02-25 LAB — CBC WITH DIFFERENTIAL/PLATELET
Basophils Absolute: 0.1 10*3/uL (ref 0.0–0.1)
Basophils Relative: 1.4 % (ref 0.0–3.0)
Eosinophils Absolute: 0 10*3/uL (ref 0.0–0.7)
Eosinophils Relative: 0.8 % (ref 0.0–5.0)
HCT: 37.7 % (ref 36.0–46.0)
Hemoglobin: 12.8 g/dL (ref 12.0–15.0)
Lymphocytes Relative: 40.5 % (ref 12.0–46.0)
Lymphs Abs: 2 10*3/uL (ref 0.7–4.0)
MCHC: 33.9 g/dL (ref 30.0–36.0)
MCV: 94.7 fl (ref 78.0–100.0)
Monocytes Absolute: 0.7 10*3/uL (ref 0.1–1.0)
Monocytes Relative: 15.1 % — ABNORMAL HIGH (ref 3.0–12.0)
Neutro Abs: 2 10*3/uL (ref 1.4–7.7)
Neutrophils Relative %: 42.2 % — ABNORMAL LOW (ref 43.0–77.0)
Platelets: 194 10*3/uL (ref 150.0–400.0)
RBC: 3.98 Mil/uL (ref 3.87–5.11)
RDW: 13.3 % (ref 11.5–15.5)
WBC: 4.8 10*3/uL (ref 4.0–10.5)

## 2022-02-25 LAB — LIPID PANEL
Cholesterol: 169 mg/dL (ref 0–200)
HDL: 46.8 mg/dL (ref >39.00)
LDL Cholesterol: 111 mg/dL — ABNORMAL HIGH (ref 0–99)
NonHDL: 122.64
Total CHOL/HDL Ratio: 4
Triglycerides: 59 mg/dL (ref 0.0–149.0)
VLDL: 11.8 mg/dL (ref 0.0–40.0)

## 2022-02-25 LAB — COMPREHENSIVE METABOLIC PANEL
ALT: 10 U/L (ref 0–35)
AST: 13 U/L (ref 0–37)
Albumin: 4.2 g/dL (ref 3.5–5.2)
Alkaline Phosphatase: 31 U/L — ABNORMAL LOW (ref 39–117)
BUN: 10 mg/dL (ref 6–23)
CO2: 26 mEq/L (ref 19–32)
Calcium: 9.1 mg/dL (ref 8.4–10.5)
Chloride: 103 mEq/L (ref 96–112)
Creatinine, Ser: 0.71 mg/dL (ref 0.40–1.20)
GFR: 115.27 mL/min (ref >60.00)
Glucose, Bld: 86 mg/dL (ref 70–99)
Potassium: 3.8 mEq/L (ref 3.5–5.1)
Sodium: 135 mEq/L (ref 135–145)
Total Bilirubin: 0.4 mg/dL (ref 0.2–1.2)
Total Protein: 7.8 g/dL (ref 6.0–8.3)

## 2022-02-25 LAB — POCT URINE PREGNANCY: Preg Test, Ur: POSITIVE — AB

## 2022-02-25 NOTE — Progress Notes (Unsigned)
Subjective  Chief Complaint  Patient presents with   Annual Exam    Fasting w/ Labs   Hand Pain    Pt c/o right index finger being swollen due to killing a fly on 8/16. Has tried ice for swelling which did help some.     HPI: Joanne Santos is a 31 y.o. female who presents to [facility] [place] today for a Female Wellness Visit.  She also has the concerns and/or needs as listed above in the chief complaint. These will be addressed in addition to the Health Maintenance Visit.   Wellness Visit: annual visit with health maintenance review and exam with Pap  HM: remains off of birth control. Reports intermittent condom use or coitus interruptus. Does now want to be pregnant but does not want to use birth control (we have discussed many options). She is a few days late for this months menstrual cycle.  Pap is due. Has a thin discharge again. Recently treated for bv (has h/o recurrent BV/yeast).  Chronic disease management visit and/or acute problem visit: 8/16: was swatting at a fly with a shoe and right index finger was hyperextended. Since has a swollen painful joint and can't bend her finger well.   Assessment  1. Annual physical exam   2. Menstrual period late   3. Cervical cancer screening   4. Acute vaginitis   5. Birth control counseling   6. Finger pain, right      Plan  Female Wellness Visit: Age appropriate Health Maintenance and Prevention measures were discussed with patient. Included topics are cancer screening recommendations, ways to keep healthy (see AVS) including dietary and exercise recommendations, regular eye and dental care, use of seat belts, and avoidance of moderate alcohol use and tobacco use. Await pap. STD screening done.  BMI: discussed patient's BMI and encouraged positive lifestyle modifications to help get to or maintain a target BMI. HM needs and immunizations were addressed and ordered. See below for orders. See HM and immunization  section for updates. Routine labs and screening tests ordered including cmp, cbc and lipids where appropriate. Discussed recommendations regarding Vit D and calcium supplementation (see AVS)  Chronic disease f/u and/or acute problem visit: (deemed necessary to be done in addition to the wellness visit): Late menses:  + urine preg today. Counseling done.  Std screen and test for vaginitis: clinically c/w yeast infection. Will await test results . Finger injury: splint and check xray.   Follow up: 12 mo for cpe/return when ready to start [facility]   Orders Placed This Encounter  Procedures   DG Finger Index Right   CBC with Differential/Platelet   Comprehensive metabolic panel   Lipid panel   HIV antibody (with reflex)   POCT urine pregnancy   No orders of the defined types were placed in this encounter.      Body mass index is 30.19 kg/m. Wt Readings from Last 3 Encounters:  02/25/22 181 lb 6.4 oz (82.3 kg)  02/02/22 178 lb 12.8 oz (81.1 kg)  01/30/22 180 lb (81.6 kg)     Patient Active Problem List   Diagnosis Date Noted   Genital herpes 10/07/2016    Overview:  By pcr from lesion    Bacterial vaginosis 09/19/2016   Health Maintenance  Topic Date Due   COVID-19 Vaccine (3 - Moderna series) 05/18/2020   PAP-Cervical Cytology Screening  06/02/2022   PAP SMEAR-Modifier  06/02/2022   TETANUS/TDAP  12/18/2022   HPV VACCINES  Completed   Hepatitis C Screening  Completed   HIV Screening  Completed   Immunization History  Administered Date(s) Administered   HPV Quadrivalent 12/14/2007, 11/03/2009, 12/17/2012   Influenza,inj,Quad PF,6+ Mos 02/24/2020   Moderna Sars-Covid-2 Vaccination 02/24/2020, 03/23/2020   Tdap 12/17/2012   We updated and reviewed the patient's past history in detail and it is documented below. Allergies: Patient  reports current alcohol use. Past Medical History Patient  has a past medical history of BV (bacterial vaginosis) and Febrile seizure  (HCC). Past Surgical History Patient  has a past surgical history that includes Cesarean section (2016). Social History   Socioeconomic History   Marital status: Single    Spouse name: Not on file   Number of children: Not on file   Years of education: Not on file   Highest education level: Not on file  Occupational History   Not on file  Tobacco Use   Smoking status: Never   Smokeless tobacco: Never  Vaping Use   Vaping Use: Former  Substance and Sexual Activity   Alcohol use: Yes    Comment: weekly   Drug use: No   Sexual activity: Yes    Birth control/protection: Implant  Other Topics Concern   Not on file  Social History Narrative   Not on file   Social Determinants of Health   Financial Resource Strain: Not on file  Food Insecurity: Not on file  Transportation Needs: Not on file  Physical Activity: Not on file  Stress: Not on file  Social Connections: Not on file   Family History  Problem Relation Age of Onset   Early death Mother    Diabetes Father    Hypertension Father    Diabetes Maternal Grandmother    Stroke Maternal Grandmother    Hypertension Sister     Review of Systems: Constitutional: negative for fever or malaise Ophthalmic: negative for photophobia, double vision or loss of vision Cardiovascular: negative for chest pain, dyspnea on exertion, or new LE swelling Respiratory: negative for SOB or persistent cough Gastrointestinal: negative for abdominal pain, change in bowel habits or melena Genitourinary: negative for dysuria or gross hematuria, no abnormal uterine bleeding or disharge Musculoskeletal: negative for new gait disturbance or muscular weakness Integumentary: negative for new or persistent rashes, no breast lumps Neurological: negative for TIA or stroke symptoms Psychiatric: negative for SI or delusions Allergic/Immunologic: negative for hives  Patient Care Team    Relationship Specialty Notifications Start End  Willow Ora, MD PCP - General Family Medicine  04/11/17     Objective  Vitals: BP (!) 102/54 (BP Location: Left Arm, Patient Position: Sitting, Cuff Size: Large)   Pulse 63   Temp 98.3 F (36.8 C) (Temporal)   Ht 5\' 5"  (1.651 m)   Wt 181 lb 6.4 oz (82.3 kg)   LMP 01/25/2022 (Exact Date)   SpO2 99%   BMI 30.19 kg/m  General:  Well developed, well nourished, no acute distress  Psych:  Alert and orientedx3,normal mood and affect HEENT:  Normocephalic, atraumatic, non-icteric sclera, PERRL, supple neck without adenopathy, mass or thyromegaly Cardiovascular:  Normal S1, S2, RRR without gallop, rub or murmur Respiratory:  Good breath sounds bilaterally, CTAB with normal respiratory effort Gastrointestinal: normal bowel sounds, soft, non-tender, no noted masses. No HSM MSK: no deformities, contusions. Joints are without erythema or swelling.  Skin:  Warm, no rashes or suspicious lesions noted Neurologic:    Mental status is normal. Gross motor and sensory exams are  normal. Normal gait. No tremor Pelvic Exam: Normal external genitalia, no vulvar or vaginal lesions present. Thin white discharge present. Clear cervix w/o CMT. Bimanual exam reveals a nontender fundus w/o masses, nl size. No adnexal masses present. No inguinal adenopathy. A PAP smear was performed.   Office Visit on 02/25/2022  Component Date Value Ref Range Status   WBC 02/25/2022 4.8  4.0 - 10.5 K/uL Final   RBC 02/25/2022 3.98  3.87 - 5.11 Mil/uL Final   Hemoglobin 02/25/2022 12.8  12.0 - 15.0 g/dL Final   HCT 16/04/9603 37.7  36.0 - 46.0 % Final   MCV 02/25/2022 94.7  78.0 - 100.0 fl Final   MCHC 02/25/2022 33.9  30.0 - 36.0 g/dL Final   RDW 54/03/8118 13.3  11.5 - 15.5 % Final   Platelets 02/25/2022 194.0  150.0 - 400.0 K/uL Final   Neutrophils Relative % 02/25/2022 42.2 (L)  43.0 - 77.0 % Final   Lymphocytes Relative 02/25/2022 40.5  12.0 - 46.0 % Final   Monocytes Relative 02/25/2022 15.1 (H)  3.0 - 12.0 % Final   Eosinophils  Relative 02/25/2022 0.8  0.0 - 5.0 % Final   Basophils Relative 02/25/2022 1.4  0.0 - 3.0 % Final   Neutro Abs 02/25/2022 2.0  1.4 - 7.7 K/uL Final   Lymphs Abs 02/25/2022 2.0  0.7 - 4.0 K/uL Final   Monocytes Absolute 02/25/2022 0.7  0.1 - 1.0 K/uL Final   Eosinophils Absolute 02/25/2022 0.0  0.0 - 0.7 K/uL Final   Basophils Absolute 02/25/2022 0.1  0.0 - 0.1 K/uL Final   Sodium 02/25/2022 135  135 - 145 mEq/L Final   Potassium 02/25/2022 3.8  3.5 - 5.1 mEq/L Final   Chloride 02/25/2022 103  96 - 112 mEq/L Final   CO2 02/25/2022 26  19 - 32 mEq/L Final   Glucose, Bld 02/25/2022 86  70 - 99 mg/dL Final   BUN 14/78/2956 10  6 - 23 mg/dL Final   Creatinine, Ser 02/25/2022 0.71  0.40 - 1.20 mg/dL Final   Total Bilirubin 02/25/2022 0.4  0.2 - 1.2 mg/dL Final   Alkaline Phosphatase 02/25/2022 31 (L)  39 - 117 U/L Final   AST 02/25/2022 13  0 - 37 U/L Final   ALT 02/25/2022 10  0 - 35 U/L Final   Total Protein 02/25/2022 7.8  6.0 - 8.3 g/dL Final   Albumin 21/30/8657 4.2  3.5 - 5.2 g/dL Final   GFR 84/69/6295 115.27  >60.00 mL/min Final   Calcium 02/25/2022 9.1  8.4 - 10.5 mg/dL Final   Cholesterol 28/41/3244 169  0 - 200 mg/dL Final   Triglycerides 07/06/7251 59.0  0.0 - 149.0 mg/dL Final   HDL 66/44/0347 46.80  >39.00 mg/dL Final   VLDL 42/59/5638 11.8  0.0 - 40.0 mg/dL Final   LDL Cholesterol 02/25/2022 111 (H)  0 - 99 mg/dL Final   Total CHOL/HDL Ratio 02/25/2022 4   Final   NonHDL 02/25/2022 122.64   Final   Preg Test, Ur 02/25/2022 Positive (A)  Negative Final    Commons side effects, risks, benefits, and alternatives for medications and treatment plan prescribed today were discussed, and the patient expressed understanding of the given instructions. Patient is instructed to call or message via MyChart if he/she has any questions or concerns regarding our treatment plan. No barriers to understanding were identified. We discussed Red Flag symptoms and signs in detail. Patient expressed  understanding regarding what to do in case of urgent or  emergency type symptoms.  Medication list was reconciled, printed and provided to the patient in AVS. Patient instructions and summary information was reviewed with the patient as documented in the AVS. This note was prepared with assistance of Dragon voice recognition software. Occasional wrong-word or sound-a-like substitutions may have occurred due to the inherent limitations of voice recognition software  This visit occurred during the SARS-CoV-2 public health emergency.  Safety protocols were in place, including screening questions prior to the visit, additional usage of staff PPE, and extensive cleaning of exam room while observing appropriate contact time as indicated for disinfecting solutions.

## 2022-02-25 NOTE — Patient Instructions (Signed)
Please return in 12 months for your annual complete physical; please come fasting.   I will release your lab results to you on your MyChart account with further instructions. You may see the results before I do, but when I review them I will send you a message with my report or have my assistant call you if things need to be discussed. Please reply to my message with any questions. Thank you!   Please go to our [facility] office to get your xrays done. You can walk in M-F between 8:30am- noon or 1pm - 5pm. Tell them you are there for xrays ordered by me. They will send me the results, then I will let you know the results with instructions.   Address: [address].  The Xray department is located in the basement.    If you have any questions or concerns, please don't hesitate to send me a message via MyChart or call the office at [phone]. Thank you for visiting with [place] today! It's our pleasure caring for you.   Please do these things to maintain good health!  Exercise at least 30-45 minutes a day,  4-5 days a week.  Eat a low-fat diet with lots of fruits and vegetables, up to 7-9 servings per day. Drink plenty of water daily. Try to drink 8 8oz glasses per day. Seatbelts can save your life. Always wear your seatbelt. Place Smoke Detectors on every level of your home and check batteries every year. Schedule an appointment with an eye doctor for an eye exam every 1-2 years Safe sex - use condoms to protect yourself from STDs if you could be exposed to these types of infections. Use birth control if you do not want to become pregnant and are sexually active. Avoid heavy alcohol use. If you drink, keep it to less than 2 drinks/day and not every day. Health Care Power of Attorney.  Choose someone you trust that could speak for you if you became unable to speak for yourself. Depression is common in our stressful world.If you're feeling down or losing interest in things you  normally enjoy, please come in for a visit. If anyone is threatening or hurting you, please get help. Physical or Emotional Violence is never OK.

## 2022-02-28 LAB — CERVICOVAGINAL ANCILLARY ONLY
Bacterial Vaginitis (gardnerella): NEGATIVE
Candida Glabrata: POSITIVE — AB
Candida Vaginitis: NEGATIVE
Comment: NEGATIVE
Comment: NEGATIVE
Comment: NEGATIVE

## 2022-02-28 LAB — HIV ANTIBODY (ROUTINE TESTING W REFLEX): HIV 1&2 Ab, 4th Generation: NONREACTIVE

## 2022-03-02 LAB — CYTOLOGY - PAP
Comment: NEGATIVE
Diagnosis: NEGATIVE
High risk HPV: NEGATIVE

## 2022-03-25 ENCOUNTER — Ambulatory Visit (INDEPENDENT_AMBULATORY_CARE_PROVIDER_SITE_OTHER)
Admission: RE | Admit: 2022-03-25 | Discharge: 2022-03-25 | Disposition: A | Discharge status: Home / Self Care | Payer: Medicaid Other | Source: Ambulatory Visit | Attending: Family Medicine | Admitting: Family Medicine

## 2022-03-25 DIAGNOSIS — M7989 Other specified soft tissue disorders: Secondary | ICD-10-CM | POA: Diagnosis not present

## 2022-03-25 DIAGNOSIS — M79644 Pain in right finger(s): Secondary | ICD-10-CM | POA: Diagnosis not present

## 2022-03-25 DIAGNOSIS — S6991XA Unspecified injury of right wrist, hand and finger(s), initial encounter: Secondary | ICD-10-CM | POA: Diagnosis not present

## 2022-03-28 ENCOUNTER — Encounter: Payer: Self-pay | Admitting: Family Medicine

## 2022-04-27 ENCOUNTER — Telehealth: Payer: Self-pay | Admitting: Family Medicine

## 2022-04-27 DIAGNOSIS — Z0279 Encounter for issue of other medical certificate: Secondary | ICD-10-CM

## 2022-04-27 NOTE — Telephone Encounter (Signed)
Pt will pick up tomorrow.

## 2022-04-27 NOTE — Telephone Encounter (Signed)
..  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  Yes   Individual made aware of 3-5 business day turn around (Y/N)?  Yes

## 2022-04-28 NOTE — Telephone Encounter (Signed)
Patient picked up form on 04/28/22 - copy provided.

## 2022-05-06 ENCOUNTER — Ambulatory Visit: Payer: Medicaid Other | Admitting: Family Medicine

## 2022-05-12 ENCOUNTER — Encounter: Payer: Self-pay | Admitting: Family Medicine

## 2022-05-12 ENCOUNTER — Ambulatory Visit: Payer: Medicaid Other | Admitting: Family Medicine

## 2022-05-12 ENCOUNTER — Other Ambulatory Visit (HOSPITAL_COMMUNITY)
Admission: RE | Admit: 2022-05-12 | Discharge: 2022-05-12 | Disposition: A | Discharge status: Home / Self Care | Payer: Medicaid Other | Source: Ambulatory Visit | Attending: Family Medicine | Admitting: Family Medicine

## 2022-05-12 VITALS — BP 110/60 | HR 78 | Temp 98.9°F | Ht 65.0 in | Wt 187.9 lb

## 2022-05-12 DIAGNOSIS — Z3009 Encounter for other general counseling and advice on contraception: Secondary | ICD-10-CM

## 2022-05-12 DIAGNOSIS — N939 Abnormal uterine and vaginal bleeding, unspecified: Secondary | ICD-10-CM | POA: Insufficient documentation

## 2022-05-12 LAB — CBC WITH DIFFERENTIAL/PLATELET
Basophils Absolute: 0 10*3/uL (ref 0.0–0.1)
Basophils Relative: 0.5 % (ref 0.0–3.0)
Eosinophils Absolute: 0 10*3/uL (ref 0.0–0.7)
Eosinophils Relative: 0.8 % (ref 0.0–5.0)
HCT: 37.4 % (ref 36.0–46.0)
Hemoglobin: 12.6 g/dL (ref 12.0–15.0)
Lymphocytes Relative: 41.4 % (ref 12.0–46.0)
Lymphs Abs: 1.9 10*3/uL (ref 0.7–4.0)
MCHC: 33.7 g/dL (ref 30.0–36.0)
MCV: 94.5 fl (ref 78.0–100.0)
Monocytes Absolute: 0.6 10*3/uL (ref 0.1–1.0)
Monocytes Relative: 13.3 % — ABNORMAL HIGH (ref 3.0–12.0)
Neutro Abs: 2.1 10*3/uL (ref 1.4–7.7)
Neutrophils Relative %: 44 % (ref 43.0–77.0)
Platelets: 249 10*3/uL (ref 150.0–400.0)
RBC: 3.96 Mil/uL (ref 3.87–5.11)
RDW: 13.3 % (ref 11.5–15.5)
WBC: 4.7 10*3/uL (ref 4.0–10.5)

## 2022-05-12 NOTE — Progress Notes (Signed)
Subjective  CC:  Chief Complaint  Patient presents with   Vaginal Bleeding    Pt stated that she had an abortion 03/22/2022 and she is still having some vaginal bleeding     HPI: Joanne Santos is a 31 y.o. female who presents to the office today to address the problems listed above in the chief complaint. 31 year old female status post a medical therapeutic abortion in September 19 as noted above.  She was treated with misoprostol and possibly Cytotec although I do not have records.  No known complications.  2-week follow-up visit did show a negative urine pregnancy test.  Since, she has had irregular bleeding.  She had several weeks in October with very light bleeding.  Then at the end of October had heavier bleeding, and now having some clots.  She has mild menstrual-like cramps but no other significant pain.  No fevers or chills.  She does complain of vaginal odor but no obvious discharge.  She has had sexual intercourse with a condom only once in the interim.  She denies urinary symptoms.  Lab testing from August showed BV and yeast infections that were both treated. Contraception: She continues to struggle with this decision.  Reviewed all of her options.  She currently does not feel that she will be regularly sexually active, however she will use condoms and the morning-after pill if needed.  Assessment  1. Vaginal bleeding   2. Birth control counseling      Plan  Vaginal bleeding: After medical therapeutic abortion, will rule out retained products of conception with an ultrasound.  She has a benign abdomen.  No red flag symptoms.  Check CBC and urine pregnancy today.  Rule out infections.  Monitor bleeding.  Trial of NSAIDs.  If does not regulate, she will return for discussion regarding how to improve her irregular bleeding.  We discussed that birth control pills could be helpful. Counseling: Discussed all her options.  Ortho Evra, Mirena or vaginal contraceptive ring are possible  options.  She declines IUD.She does not like the idea of daily birth control pills. We discussed prevention of STDs  Follow up: As needed Visit date not found  Orders Placed This Encounter  Procedures   US PELVIC COMPLETE WITH TRANSVAGINAL   CBC with Differential/Platelet   POCT urine pregnancy   No orders of the defined types were placed in this encounter.     I reviewed the patients updated PMH, FH, and SocHx.    Patient Active Problem List   Diagnosis Date Noted   Genital herpes 10/07/2016   Bacterial vaginosis 09/19/2016   No outpatient medications have been marked as taking for the 05/12/22 encounter (Office Visit) with Willow Ora, MD.    Allergies: Patient has No Known Allergies. Family History: Patient family history includes Diabetes in her father and maternal grandmother; Early death in her mother; Hypertension in her father and sister; Stroke in her maternal grandmother. Social History:  Patient  reports that she has never smoked. She has never used smokeless tobacco. She reports current alcohol use. She reports that she does not use drugs.  Review of Systems: Constitutional: Negative for fever malaise or anorexia Cardiovascular: negative for chest pain Respiratory: negative for SOB or persistent cough Gastrointestinal: negative for abdominal pain  Objective  Vitals: BP 110/60   Pulse 78   Temp 98.9 F (37.2 C)   Ht 5\' 5"  (1.651 m)   Wt 187 lb 14.4 oz (85.2 kg)   SpO2 98%  BMI 31.27 kg/m  General: no acute distress , A&Ox3, appears well HEENT: PEERL, conjunctiva normal, neck is supple Cardiovascular:  RRR without murmur or gallop.  Respiratory:  Good breath sounds bilaterally, CTAB with normal respiratory effort Skin:  Warm, no rashes Abdomen, soft, nontender, nonpalpable fundus, no suprapubic tenderness, no lower pelvic tenderness.    Commons side effects, risks, benefits, and alternatives for medications and treatment plan prescribed today  were discussed, and the patient expressed understanding of the given instructions. Patient is instructed to call or message via MyChart if he/she has any questions or concerns regarding our treatment plan. No barriers to understanding were identified. We discussed Red Flag symptoms and signs in detail. Patient expressed understanding regarding what to do in case of urgent or emergency type symptoms.  Medication list was reconciled, printed and provided to the patient in AVS. Patient instructions and summary information was reviewed with the patient as documented in the AVS. This note was prepared with assistance of Dragon voice recognition software. Occasional wrong-word or sound-a-like substitutions may have occurred due to the inherent limitations of voice recognition software  This visit occurred during the SARS-CoV-2 public health emergency.  Safety protocols were in place, including screening questions prior to the visit, additional usage of staff PPE, and extensive cleaning of exam room while observing appropriate contact time as indicated for disinfecting solutions.

## 2022-05-12 NOTE — Patient Instructions (Signed)
Please follow up if symptoms do not improve or as needed.    We will call you to get you scheduled for an ultrasound.  Start a daily iron pill and advil as needed.   I will release your lab results to you on your MyChart account with further instructions. You may see the results before I do, but when I review them I will send you a message with my report or have my assistant call you if things need to be discussed. Please reply to my message with any questions. Thank you!   Ethinyl Estradiol; Norelgestromin Patches (birth control patch) What is this medication? ETHINYL ESTRADIOL;NORELGESTROMIN (ETH in il es tra DYE ole; nor el JES troe min) prevents ovulation and pregnancy. It belongs to a group of medications called contraceptives. It is a combination of the hormones estrogen and progestin. This medicine may be used for other purposes; ask your health care provider or pharmacist if you have questions. COMMON BRAND NAME(S): Ortho Christianne Borrow, ZAFEMY What should I tell my care team before I take this medication? They need to know if you have or ever had any of these conditions: Abnormal vaginal bleeding Blood clots Blood vessel disease Breast, cervical, endometrial, ovarian, liver, or uterine cancer Diabetes Gallbladder disease Having surgery Heart disease or recent heart attack High blood pressure High cholesterol or triglycerides History of irregular heartbeat or heart valve problems Kidney disease Liver disease Lupus Migraine headaches Protein C or S deficiency Recently had a baby, miscarriage, or abortion Stroke Tobacco use An unusual or allergic reaction to estrogens, progestins, other medications, foods, dyes, or preservatives Pregnant or trying to get pregnant Breastfeeding How should I use this medication? This patch is applied to the skin. Follow the directions on the prescription label. Apply to clean, dry, healthy skin on the buttock, abdomen, upper outer arm or upper  torso, in a place where it will not be rubbed by tight clothing. Do not use lotions or other cosmetics on the site where the patch will go. Press the patch firmly in place for 10 seconds to ensure good contact with the skin. Change the patch every 7 days on the same day of the week for 3 weeks. You will then have a break from the patch for 1 week, after which you will apply a new patch. Do not use your medication more often than directed. Contact your care team about the use of this medication in children. Special care may be needed. This medication has been used in female children who have started having menstrual periods. A patient package insert for the product will be given with each prescription and refill. Read this sheet carefully each time. The sheet may change frequently. Overdosage: If you think you have taken too much of this medicine contact a poison control center or emergency room at once. NOTE: This medicine is only for you. Do not share this medicine with others. What if I miss a dose? You will need to replace your patch once a week as directed. If your patch is lost or falls off, contact your care team for advice. You may need to use another form of birth control if your patch has been off for more than 1 day. What may interact with this medication? Do not take this medication with the following: Dasabuvir; ombitasvir; paritaprevir; ritonavir Ombitasvir; paritaprevir; ritonavir This medication may also interact with the following: Acetaminophen Antibiotics or medications for infections, especially rifampin, rifabutin, rifapentine, penicillins, or tetracyclines Aprepitant or fosaprepitant Armodafinil  Ascorbic acid (vitamin C) Barbiturate medications, such as phenobarbital or primidone Bosentan Certain antivirals for HIV or hepatitis Certain medications for cancer treatment Certain medications for cholesterol Certain medications for seizures, such as carbamazepine, clobazam,  felbamate, lamotrigine, oxcarbazepine, phenytoin, rufinamide, or topiramate Cyclosporine Dantrolene Elagolix Flibanserin Grapefruit juice Lesinurad Medications for diabetes Medications to treat fungal infections, such as griseofulvin, miconazole, fluconazole, ketoconazole, itraconazole, posaconazole, or voriconazole Mifepristone Mitotane Modafinil Morphine Mycophenolate St. John's wort Tamoxifen Temazepam Theophylline or aminophylline Thyroid hormones Tizanidine Tranexamic acid Ulipristal Warfarin This list may not describe all possible interactions. Give your health care provider a list of all the medicines, herbs, non-prescription drugs, or dietary supplements you use. Also tell them if you smoke, drink alcohol, or use illegal drugs. Some items may interact with your medicine. What should I watch for while using this medication? Visit your care team for regular checks on your progress. You will need a regular breast and pelvic exam and Pap smear while on this medication. Use an additional method of contraception during the first cycle that you use this patch. If you have any reason to think you are pregnant, stop using this medication right away and contact your care team. If you are using this medication for hormone related problems, it may take several cycles of use to see improvement in your condition. Smoking tobacco increases the risk of getting a blood clot or having a stroke while you are taking this medication, especially if you are older than 35 years. This medication can make your body retain fluid, making your fingers, hands, or ankles swell. Your blood pressure can go up. Contact your care team if you feel you are retaining fluid. This medication can make you more sensitive to the sun. Keep out of the sun. If you cannot avoid being in the sun, wear protective clothing and sunscreen. Do not use sun lamps, tanning beds, or tanning booths. If you wear contact lenses and  notice visual changes, or if the lenses begin to feel uncomfortable, consult your eye care specialist. Tenderness, swelling, or minor bleeding of the gums may occur. Talk to your dentist if this happens. Brushing and flossing your teeth regularly may reduce the risk of side effects. Visit your dentist on a regular basis. Tell your dentist about any medications you are taking. If you are going to have elective surgery or an MRI, tell your care team that you are using this medication. You may need to remove the patch before the procedure. Using this medication does not protect you or your partner against HIV or other sexually transmitted infections (STIs). What side effects may I notice from receiving this medication? Side effects that you should report to your care team as soon as possible: Allergic reactions--skin rash, itching, hives, swelling of the face, lips, tongue, or throat Blood clot--pain, swelling, or warmth in the leg, shortness of breath, chest pain Gallbladder problems--severe stomach pain, nausea, vomiting, fever Increase in blood pressure Liver injury--right upper belly pain, loss of appetite, nausea, light-colored stool, dark yellow or brown urine, yellowing skin or eyes, unusual weakness or fatigue New or worsening migraines or headaches Stroke--sudden numbness or weakness of the face, arm, or leg, trouble speaking, confusion, trouble walking, loss of balance or coordination, dizziness, severe headache, change in vision Unusual vaginal discharge, itching, or odor Worsening mood, feelings of depression Side effects that usually do not require medical attention (report to your care team if they continue or are bothersome): Breast pain or tenderness Dark patches of skin  on the face or other sun-exposed areas Irregular menstrual cycles or spotting Nausea Weight gain This list may not describe all possible side effects. Call your doctor for medical advice about side effects. You may  report side effects to FDA at 1-800-FDA-1088. Where should I keep my medication? Keep out of the reach of children and pets. Store at room temperature between 15 and 30 degrees C (59 and 86 degrees F). Keep the patch in its pouch until time of use. Throw away any unused medication after the expiration date. Dispose of used patches properly. Since a used patch may still contain active hormones, fold the patch in half so that it sticks to itself prior to disposal. Throw away in a place where children or pets cannot reach. NOTE: This sheet is a summary. It may not cover all possible information. If you have questions about this medicine, talk to your doctor, pharmacist, or health care provider.  2023 Elsevier/Gold Standard (2020-08-23 00:00:00)

## 2022-05-13 LAB — CERVICOVAGINAL ANCILLARY ONLY
Bacterial Vaginitis (gardnerella): POSITIVE — AB
Candida Glabrata: NEGATIVE
Candida Vaginitis: NEGATIVE
Chlamydia: NEGATIVE
Comment: NEGATIVE
Comment: NEGATIVE
Comment: NEGATIVE
Comment: NEGATIVE
Comment: NEGATIVE
Comment: NORMAL
Neisseria Gonorrhea: NEGATIVE
Trichomonas: POSITIVE — AB

## 2022-05-16 MED ORDER — METRONIDAZOLE 500 MG PO TABS: 500.0000 mg | ORAL_TABLET | Freq: Two times a day (BID) | ORAL | 0 refills | Status: DC

## 2022-05-16 NOTE — Addendum Note (Signed)
Addended by: Asencion Partridge on: 05/16/2022 12:47 PM   Modules accepted: Orders

## 2022-05-16 NOTE — Progress Notes (Signed)
Please call patient: I have reviewed his/her lab results. Her vaginal studies show that she has an STD: trichomonas. This is treated with Flagyl. She also has BV: I have ordered the medication. I recommend condom use; as well, her partner needs to be tested and treated.  thanks

## 2022-05-17 ENCOUNTER — Ambulatory Visit
Admission: RE | Admit: 2022-05-17 | Discharge: 2022-05-17 | Disposition: A | Discharge status: Home / Self Care | Payer: Medicaid Other | Source: Ambulatory Visit | Attending: Family Medicine | Admitting: Family Medicine

## 2022-05-17 ENCOUNTER — Other Ambulatory Visit: Payer: Self-pay | Admitting: Internal Medicine

## 2022-05-17 ENCOUNTER — Telehealth: Payer: Self-pay

## 2022-05-17 ENCOUNTER — Other Ambulatory Visit: Payer: Self-pay

## 2022-05-17 DIAGNOSIS — O029 Abnormal product of conception, unspecified: Secondary | ICD-10-CM

## 2022-05-17 DIAGNOSIS — N939 Abnormal uterine and vaginal bleeding, unspecified: Secondary | ICD-10-CM

## 2022-05-17 DIAGNOSIS — O034 Incomplete spontaneous abortion without complication: Secondary | ICD-10-CM

## 2022-05-17 NOTE — Telephone Encounter (Signed)
Heterogeneous echogenic material at fundal aspect of endometrial canal 21 x 12 x 20 mm containing internal blood flow on color Doppler imaging highly suspicious for retained products of conception.

## 2022-05-17 NOTE — Progress Notes (Signed)
Called and spoke to Mrs. Ruffolo she says she is not having any fevers that she is currently taking Flagyl and she just started it yesterday and has not had any fevers or chills.  I explained the results of the ultrasound that there was just a small spot that looked like some retained products of conception and that that is probably causing her ongoing bleeding this last 6 weeks.  She says that the cramping can be bad sometimes but not much right now and also the bleeding came come in spells unpredictably but when I suggested that if she had lost a lot of blood that she should go to the ER she she did not seem to feel that bad enough to go.  Therefore I reinforced my recommendations as follows "#1 keep taking the Flagyl because it serves as an antibiotic to reduce the risk of infection if they need to go back in for dilatation and curettage, #2 stay available for phone call we are making a stat referral to gynecology so that they can arrange possible dilatation and curettage, #3 if feels worse felt like losing too much blood or infected should go to the ER.  I asked if she had any questions and she did not.  She did not seem distressed or to desire any additional counseling or support.   Laddie Aquas placed a stat referral to gynecology under Dr. Modesta Messing name after discussing the ultrasound with me.

## 2022-05-18 ENCOUNTER — Other Ambulatory Visit: Payer: Self-pay | Admitting: Family Medicine

## 2022-05-18 DIAGNOSIS — O034 Incomplete spontaneous abortion without complication: Secondary | ICD-10-CM

## 2022-05-18 DIAGNOSIS — N939 Abnormal uterine and vaginal bleeding, unspecified: Secondary | ICD-10-CM

## 2022-05-21 ENCOUNTER — Other Ambulatory Visit: Payer: Self-pay

## 2022-05-21 ENCOUNTER — Emergency Department (HOSPITAL_BASED_OUTPATIENT_CLINIC_OR_DEPARTMENT_OTHER)
Admission: EM | Admit: 2022-05-21 | Discharge: 2022-05-21 | Disposition: A | Discharge status: Home / Self Care | Payer: Medicaid Other | Attending: Emergency Medicine | Admitting: Emergency Medicine

## 2022-05-21 ENCOUNTER — Encounter (HOSPITAL_BASED_OUTPATIENT_CLINIC_OR_DEPARTMENT_OTHER): Payer: Self-pay | Admitting: Emergency Medicine

## 2022-05-21 DIAGNOSIS — Z3A01 Less than 8 weeks gestation of pregnancy: Secondary | ICD-10-CM | POA: Diagnosis not present

## 2022-05-21 DIAGNOSIS — O034 Incomplete spontaneous abortion without complication: Secondary | ICD-10-CM | POA: Diagnosis not present

## 2022-05-21 DIAGNOSIS — N939 Abnormal uterine and vaginal bleeding, unspecified: Secondary | ICD-10-CM

## 2022-05-21 DIAGNOSIS — O021 Missed abortion: Secondary | ICD-10-CM | POA: Diagnosis not present

## 2022-05-21 LAB — URINALYSIS, ROUTINE W REFLEX MICROSCOPIC
Bilirubin Urine: NEGATIVE
Glucose, UA: NEGATIVE mg/dL
Nitrite: NEGATIVE
Protein, ur: 30 mg/dL — AB
Specific Gravity, Urine: 1.027 (ref 1.005–1.030)
pH: 7.5 (ref 5.0–8.0)

## 2022-05-21 LAB — CBC
HCT: 37.5 % (ref 36.0–46.0)
Hemoglobin: 12.7 g/dL (ref 12.0–15.0)
MCH: 31.4 pg (ref 26.0–34.0)
MCHC: 33.9 g/dL (ref 30.0–36.0)
MCV: 92.8 fL (ref 80.0–100.0)
Platelets: 212 10*3/uL (ref 150–400)
RBC: 4.04 MIL/uL (ref 3.87–5.11)
RDW: 12.4 % (ref 11.5–15.5)
WBC: 4.3 10*3/uL (ref 4.0–10.5)
nRBC: 0 % (ref 0.0–0.2)

## 2022-05-21 NOTE — ED Triage Notes (Signed)
Had medical abortion on 03/21/22. Continued bleeding  Seen by doc on 05/17/22, [place] completed. Patient was alerted to go to ed for retained products of conception

## 2022-05-21 NOTE — Consult Note (Addendum)
OBSTETRICS AND GYNECOLOGY ATTENDING TELEPHONE CONSULT NOTE   Consult Date: 05/21/2022   Reason for Consult: Retained products of conception s/p medical abortion on 03/21/22 Consulting Provider: Melton Alar, PA-C   Consultation Details (from provider and chart review):  Joanne Santos is a 31 y.o.  F  at [facility], sent from her [facility] Medicine doctor for management of retained products of conception after medical abortion done on 03/21/22.  Patient reports continued bleeding. No lightheadedness, dizziness, no severe bleeding.  Noted to be hemodynamically stable on presentation.    The provider presented the following relevant clinical information: Hemoglobin 12.7, platelet count 212K, Ultrasound showed heterogeneous echogenic material at fundal aspect of endometrial canal 21 x 12 x 20 mm containing internal blood flow on color Doppler imaging highly suspicious for retained products of conception.  The provider had a clinical question about management.  I performed a chart review on the patient and reviewed available documentation. BP 110/64 (BP Location: Right Arm)   Pulse 74   Temp 98.2 F (36.8 C) (Oral)   Resp 16   SpO2 100%   Exam performed by consulting provider and remarkable for small amount of ongoing bleeding, benign abdomen.  Pertinent labs and imaging:     Latest Ref Rng & Units 05/21/2022    1:35 PM 05/12/2022    2:16 PM 02/25/2022    9:50 AM  CBC  WBC 4.0 - 10.5 K/uL 4.3  4.7  4.8   Hemoglobin 12.0 - 15.0 g/dL 59.4  58.5  92.9   Hematocrit 36.0 - 46.0 % 37.5  37.4  37.7   Platelets 150 - 400 K/uL 212  249.0  194.0    US PELVIC COMPLETE WITH TRANSVAGINAL  Result Date: 05/17/2022 CLINICAL DATA:  Persistent vaginal bleeding and cramping after medical therapeutic abortion 03/21/2022; history Caesarean section, per patient negative pregnancy test recently at office visit EXAM: TRANSABDOMINAL AND TRANSVAGINAL ULTRASOUND OF PELVIS TECHNIQUE: Both  transabdominal and transvaginal ultrasound examinations of the pelvis were performed. Transabdominal technique was performed for global imaging of the pelvis including uterus, ovaries, adnexal regions, and pelvic cul-de-sac. It was necessary to proceed with endovaginal exam following the transabdominal exam to visualize the endometrium and ovaries. COMPARISON:  None Available. FINDINGS: Uterus Measurements: 10.6 x 3.9 x 5.4 cm = volume: 124 mL. Anteverted. Slightly heterogeneous myometrium. No mass. Endometrium Thickness: 13 mm. Abnormal focal heterogeneous echogenic material containing internal blood flow on color Doppler imaging cine series at fundal aspect of endometrial canal measuring 21 x 12 x 20 mm highly suspicious for retained products of conception. No endometrial fluid. Right ovary Measurements: 3.6 x 2.0 x 2.5 cm = volume: 9.1 mL. Normal morphology without mass Left ovary Measurements: 3.8 x 2.0 x 1.9 cm = volume: 7.5 mL. Normal morphology without mass Other findings No free pelvic fluid or adnexal masses. IMPRESSION: Heterogeneous echogenic material at fundal aspect of endometrial canal 21 x 12 x 20 mm containing internal blood flow on color Doppler imaging highly suspicious for retained products of conception. Remainder of exam unremarkable. These results will be called to the ordering clinician or representative by the Radiologist Assistant, and communication documented in the PACS or [employer]. Electronically Signed   By: Ulyses Southward M.D.   On: 05/17/2022 14:04     Recommendations: Given that patient is hemodynamically stable, stable hemoglobin, there is no need for emergent surgery - Patient has two options: Misoprostol 800 mcg PV x 1 dose can be given  to patient and she can expect heavier bleeding with hopefully passage of tissue within the next 12-24 hours.   If she does not want medical management, patient's information will be given to our surgical scheduler and she will be scheduled  for Suction Dilation and Curettage early next week - If bleeding worsens or if she has any concerning symptoms in the meantime, patient can come to [facility] for evaluation. -Recommended that the patient be provided with a referral to the Center for [facility] (any office, there is one there at [facility]) for follow up in 2 weeks.   Thank you for this telephone consult.  If additional recommendations are needed, please call [phone] [facility] OB/GYN Consult Attending Monday-Friday 8am - 5pm) or [phone] [facility] OB/GYN Attending On Call all day, every day).    I spent approximately 5 minutes directly consulting with the provider and verbally discussing this case. Additional 25 minutes was spent performing chart review and documentation.    Jaynie Collins, MD Obstetrician & Gynecologist, [facility] for [facility], [facility]   Addendum:  Was notified that patient desires surgical management. Information sent to to our surgical scheduler Patient will be contacted early next week with information about her procedure. If bleeding worsens or if she has any concerning symptoms in the meantime, patient can come to [facility] for evaluation.   Jaynie Collins, MD, FACOG Obstetrician & Gynecologist, [facility] for [employer], [facility]

## 2022-05-21 NOTE — Discharge Instructions (Addendum)
Thank you for allowing me to be part of your care today.  You were seen for continued vaginal bleeding due to retained products of conception.  I have informed the gynecology on-call that you would like to proceed with the surgical option.  Their surgical scheduler will contact you directly to set this up sometime early next week.  If you develop new or worsening symptoms, or develop severe bleeding, it is recommended you go to the [facility] for further evaluation and management.  Please continue taking your Flagyl as prescribed.  You also need to schedule a follow-up appointment in 2 weeks with one of the Center for [employer].

## 2022-05-21 NOTE — ED Notes (Signed)
Discharge paperwork given and verbally understood. 

## 2022-05-21 NOTE — ED Provider Notes (Signed)
[facility] EMERGENCY DEPT Provider Note   CSN: [id number] Arrival date & time: 05/21/22  1319     History  Chief Complaint  Patient presents with   Vaginal Bleeding    Joanne Santos is a 31 y.o. female presents to the ED sent by her family medicine doctor for concerns of vaginal bleeding and retained products of conception.  She has been having ongoing bleeding for the last 6 weeks.  She has also been on Flagyl for BV.  She states that the bleeding is intermittent and occasionally associated with abdominal cramping.  She has no complaint in the ER at this time.  Denies fever, chills, vaginal discharge, urinary changes, nausea, vomiting, diarrhea, abdominal pain.      Home Medications Prior to Admission medications   Medication Sig Start Date End Date Taking? Authorizing Provider  metroNIDAZOLE (FLAGYL) 500 MG tablet Take 1 tablet (500 mg total) by mouth 2 (two) times daily for 7 days. 05/16/22 05/23/22  Willow Ora, MD      Allergies    Patient has no known allergies.    Review of Systems   Review of Systems  Constitutional:  Negative for chills and fever.  Gastrointestinal:  Negative for abdominal pain, diarrhea, nausea and vomiting.  Genitourinary:  Positive for vaginal bleeding. Negative for dysuria and vaginal discharge.    Physical Exam Updated Vital Signs BP 100/66 (BP Location: Right Arm)   Pulse 67   Temp 98.3 F (36.8 C) (Oral)   Resp 16   SpO2 100%  Physical Exam Vitals and nursing note reviewed.  Constitutional:      General: She is not in acute distress.    Appearance: Normal appearance. She is not ill-appearing or diaphoretic.  Cardiovascular:     Rate and Rhythm: Normal rate and regular rhythm.     Pulses: Normal pulses.     Heart sounds: Normal heart sounds.  Pulmonary:     Effort: Pulmonary effort is normal.     Breath sounds: Normal breath sounds and air entry.  Abdominal:     General: Abdomen is flat.     Palpations:  Abdomen is soft.     Tenderness: There is no abdominal tenderness.  Skin:    General: Skin is warm and dry.     Capillary Refill: Capillary refill takes less than 2 seconds.     Coloration: Skin is not pale.  Neurological:     Mental Status: She is alert. Mental status is at baseline.  Psychiatric:        Mood and Affect: Mood normal.        Behavior: Behavior normal.     ED Results / Procedures / Treatments   Labs (all labs ordered are listed, but only abnormal results are displayed) Labs Reviewed  URINALYSIS, ROUTINE W REFLEX MICROSCOPIC - Abnormal; Notable for the following components:      Result Value   Hgb urine dipstick MODERATE (*)    Ketones, ur TRACE (*)    Protein, ur 30 (*)    Leukocytes,Ua TRACE (*)    All other components within normal limits  CBC    EKG None  Radiology No results found.  Procedures Procedures    Medications Ordered in ED Medications - No data to display  ED Course/ Medical Decision Making/ A&P                           Medical Decision Making Amount  and/or Complexity of Data Reviewed Labs: ordered.   Patient presents to the ER stating she was sent by her primary care physician for urgent evaluation due to retained products of conception.  Previous notes from family medicine were reviewed.  Patient had ultrasound done that demonstrated retained products of conception.  She has had ongoing bleeding for the past 6 weeks.  It does not appear that she has established with a gynecologist.  This bleeding has not changed or worsened in any way per patient.  She is also been on Flagyl for BV treatment.  She denies any vaginal discharge or malodorous discharge.  She has intermittent abdominal cramping, but not severe constant abdominal pain.  On exam, patient is resting comfortably in bed.  Skin is warm and dry and of normal color.  Heart rate and rhythm are normal.  Lung sounds are clear to auscultation bilaterally.  Abdomen is soft and  nontender on palpation.  She is not complaining of any active discharge or bleeding. Labs were obtained and personally reviewed by me.  CBC shows no evidence of anemia, hemoglobin 12.7.  No evidence of leukocytosis, no concern for active infection at this time.  Consulted with on-call gynecology for recommendations.  Spoke with Dr. Macon Large who recommended patient could receive another dose of misoprostol or elected for the surgical option of a suction dilation and curettage early next week.  Patient is hemodynamically stable, has a normal hemoglobin, there is no need for emergent surgery.  Discussed options with patient.  Patient elected for surgical option.  Informed patient she will be contacted directly by surgical scheduler to arrange for the procedure.  Discussed with patient if she develops worsening bleeding or has any new or concerning symptoms she can proceed to the [facility] for evaluation at [facility].  Provided [facility] for follow-up in 2 weeks.  Return precautions were given.        Final Clinical Impression(s) / ED Diagnoses Final diagnoses:  Retained products of conception following abortion  Complaint of vaginal bleeding    Rx / DC Orders ED Discharge Orders     None         Lenard Simmer, PA 05/21/22 Othella Boyer, MD 05/22/22 0006

## 2022-05-23 ENCOUNTER — Telehealth: Payer: Self-pay

## 2022-05-23 NOTE — Telephone Encounter (Signed)
Called patient, surgery date time, location and preop instructions given. Patient expressed understanding.

## 2022-05-24 ENCOUNTER — Other Ambulatory Visit: Payer: Self-pay

## 2022-05-24 ENCOUNTER — Encounter (HOSPITAL_COMMUNITY): Payer: Self-pay | Admitting: Obstetrics and Gynecology

## 2022-05-24 MED ORDER — DOXYCYCLINE HYCLATE 100 MG IV SOLR
200.0000 mg | INTRAVENOUS | Status: AC
  Administered 2022-05-25: 200 mg via INTRAVENOUS
  Filled 2022-05-24: qty 200

## 2022-05-24 NOTE — Progress Notes (Addendum)
Ms Mccarrell denies chest pain or shortness of breath. Patient denies having any s/s of Covid in her household, also denies any known exposure to Covid.  Ms Capece reports that she does have an itchy throat and a small runny nose , clear drainage, no fever, cough, she does not feel bad. I informed Antionette Poles, PA-C, patient will have to be evaluated in am. Ms [facility] PCP is DR. Sissy Hoff

## 2022-05-25 ENCOUNTER — Ambulatory Visit (HOSPITAL_COMMUNITY)
Admission: RE | Admit: 2022-05-25 | Discharge: 2022-05-25 | Disposition: A | Discharge status: Home / Self Care | Payer: Medicaid Other | Source: Ambulatory Visit | Attending: Obstetrics and Gynecology | Admitting: Obstetrics and Gynecology

## 2022-05-25 ENCOUNTER — Other Ambulatory Visit: Payer: Self-pay

## 2022-05-25 ENCOUNTER — Encounter (HOSPITAL_COMMUNITY)
Admission: RE | Disposition: A | Discharge status: Home / Self Care | Payer: Self-pay | Source: Ambulatory Visit | Attending: Obstetrics and Gynecology

## 2022-05-25 ENCOUNTER — Ambulatory Visit (HOSPITAL_BASED_OUTPATIENT_CLINIC_OR_DEPARTMENT_OTHER): Payer: Medicaid Other | Admitting: Physician Assistant

## 2022-05-25 ENCOUNTER — Ambulatory Visit (HOSPITAL_COMMUNITY): Payer: Medicaid Other | Admitting: Physician Assistant

## 2022-05-25 DIAGNOSIS — I10 Essential (primary) hypertension: Secondary | ICD-10-CM | POA: Diagnosis not present

## 2022-05-25 DIAGNOSIS — N939 Abnormal uterine and vaginal bleeding, unspecified: Secondary | ICD-10-CM | POA: Diagnosis not present

## 2022-05-25 DIAGNOSIS — O034 Incomplete spontaneous abortion without complication: Secondary | ICD-10-CM

## 2022-05-25 DIAGNOSIS — Z01812 Encounter for preprocedural laboratory examination: Secondary | ICD-10-CM

## 2022-05-25 DIAGNOSIS — O071 Delayed or excessive hemorrhage following failed attempted termination of pregnancy: Secondary | ICD-10-CM | POA: Insufficient documentation

## 2022-05-25 DIAGNOSIS — O021 Missed abortion: Secondary | ICD-10-CM | POA: Diagnosis not present

## 2022-05-25 DIAGNOSIS — Z3A00 Weeks of gestation of pregnancy not specified: Secondary | ICD-10-CM | POA: Diagnosis not present

## 2022-05-25 HISTORY — PX: DILATION AND EVACUATION: SHX1459

## 2022-05-25 HISTORY — DX: Herpesviral infection of urogenital system, unspecified: A60.00

## 2022-05-25 LAB — CBC
HCT: 37.7 % (ref 36.0–46.0)
Hemoglobin: 13.2 g/dL (ref 12.0–15.0)
MCH: 32.4 pg (ref 26.0–34.0)
MCHC: 35 g/dL (ref 30.0–36.0)
MCV: 92.6 fL (ref 80.0–100.0)
Platelets: 239 10*3/uL (ref 150–400)
RBC: 4.07 MIL/uL (ref 3.87–5.11)
RDW: 12.3 % (ref 11.5–15.5)
WBC: 3.3 10*3/uL — ABNORMAL LOW (ref 4.0–10.5)
nRBC: 0 % (ref 0.0–0.2)

## 2022-05-25 LAB — TYPE AND SCREEN
ABO/RH(D): B POS
Antibody Screen: NEGATIVE

## 2022-05-25 LAB — ABO/RH: ABO/RH(D): B POS

## 2022-05-25 SURGERY — DILATION AND EVACUATION, UTERUS
Anesthesia: General

## 2022-05-25 MED ORDER — EPHEDRINE SULFATE-NACL 50-0.9 MG/10ML-% IV SOSY
PREFILLED_SYRINGE | INTRAVENOUS | Status: DC | PRN
  Administered 2022-05-25: 5 mg via INTRAVENOUS

## 2022-05-25 MED ORDER — EPHEDRINE 5 MG/ML INJ
INTRAVENOUS | Status: AC
  Filled 2022-05-25: qty 5

## 2022-05-25 MED ORDER — ONDANSETRON HCL 4 MG/2ML IJ SOLN
INTRAMUSCULAR | Status: AC
  Filled 2022-05-25: qty 2

## 2022-05-25 MED ORDER — OXYCODONE-ACETAMINOPHEN 5-325 MG PO TABS: 1.0000 | ORAL_TABLET | Freq: Four times a day (QID) | ORAL | 0 refills | Status: DC | PRN

## 2022-05-25 MED ORDER — PROPOFOL 10 MG/ML IV BOLUS
INTRAVENOUS | Status: AC
  Filled 2022-05-25: qty 20

## 2022-05-25 MED ORDER — CELECOXIB 200 MG PO CAPS
200.0000 mg | ORAL_CAPSULE | Freq: Once | ORAL | Status: AC
  Administered 2022-05-25: 200 mg via ORAL
  Filled 2022-05-25: qty 1

## 2022-05-25 MED ORDER — FENTANYL CITRATE (PF) 100 MCG/2ML IJ SOLN
INTRAMUSCULAR | Status: AC
  Filled 2022-05-25: qty 2

## 2022-05-25 MED ORDER — CHLOROPROCAINE HCL 1 % IJ SOLN
INTRAMUSCULAR | Status: AC
  Filled 2022-05-25: qty 30

## 2022-05-25 MED ORDER — ONDANSETRON HCL 4 MG/2ML IJ SOLN
INTRAMUSCULAR | Status: DC | PRN
  Administered 2022-05-25: 4 mg via INTRAVENOUS

## 2022-05-25 MED ORDER — CHLOROPROCAINE HCL 1 % IJ SOLN
INTRAMUSCULAR | Status: DC | PRN
  Administered 2022-05-25: 10 mL

## 2022-05-25 MED ORDER — MIDAZOLAM HCL 2 MG/2ML IJ SOLN
INTRAMUSCULAR | Status: AC
  Filled 2022-05-25: qty 2

## 2022-05-25 MED ORDER — FENTANYL CITRATE (PF) 250 MCG/5ML IJ SOLN
INTRAMUSCULAR | Status: DC | PRN
  Administered 2022-05-25 (×2): 50 ug via INTRAVENOUS

## 2022-05-25 MED ORDER — FENTANYL CITRATE (PF) 100 MCG/2ML IJ SOLN
25.0000 ug | INTRAMUSCULAR | Status: DC | PRN
  Administered 2022-05-25: 50 ug via INTRAVENOUS

## 2022-05-25 MED ORDER — FENTANYL CITRATE (PF) 250 MCG/5ML IJ SOLN
INTRAMUSCULAR | Status: AC
  Filled 2022-05-25: qty 5

## 2022-05-25 MED ORDER — IBUPROFEN 600 MG PO TABS: 600.0000 mg | ORAL_TABLET | Freq: Four times a day (QID) | ORAL | 3 refills | Status: DC | PRN

## 2022-05-25 MED ORDER — LIDOCAINE 2% (20 MG/ML) 5 ML SYRINGE
INTRAMUSCULAR | Status: DC | PRN
  Administered 2022-05-25: 60 mg via INTRAVENOUS

## 2022-05-25 MED ORDER — CHLORHEXIDINE GLUCONATE 0.12 % MT SOLN
15.0000 mL | Freq: Once | OROMUCOSAL | Status: AC
  Administered 2022-05-25: 15 mL via OROMUCOSAL
  Filled 2022-05-25: qty 15

## 2022-05-25 MED ORDER — LACTATED RINGERS IV SOLN: INTRAVENOUS | Status: DC

## 2022-05-25 MED ORDER — DEXAMETHASONE SODIUM PHOSPHATE 10 MG/ML IJ SOLN
INTRAMUSCULAR | Status: DC | PRN
  Administered 2022-05-25: 10 mg via INTRAVENOUS

## 2022-05-25 MED ORDER — PROMETHAZINE HCL 25 MG/ML IJ SOLN: 6.2500 mg | INTRAMUSCULAR | Status: DC | PRN

## 2022-05-25 MED ORDER — OXYCODONE HCL 5 MG PO TABS
5.0000 mg | ORAL_TABLET | Freq: Once | ORAL | Status: AC | PRN
  Administered 2022-05-25: 5 mg via ORAL

## 2022-05-25 MED ORDER — OXYCODONE HCL 5 MG PO TABS
ORAL_TABLET | ORAL | Status: AC
  Filled 2022-05-25: qty 1

## 2022-05-25 MED ORDER — ACETAMINOPHEN 500 MG PO TABS
1000.0000 mg | ORAL_TABLET | Freq: Once | ORAL | Status: AC
  Administered 2022-05-25: 1000 mg via ORAL
  Filled 2022-05-25: qty 2

## 2022-05-25 MED ORDER — MIDAZOLAM HCL 2 MG/2ML IJ SOLN
INTRAMUSCULAR | Status: DC | PRN
  Administered 2022-05-25: 2 mg via INTRAVENOUS

## 2022-05-25 MED ORDER — ORAL CARE MOUTH RINSE: 15.0000 mL | Freq: Once | OROMUCOSAL | Status: AC

## 2022-05-25 MED ORDER — DEXAMETHASONE SODIUM PHOSPHATE 10 MG/ML IJ SOLN
INTRAMUSCULAR | Status: AC
  Filled 2022-05-25: qty 1

## 2022-05-25 MED ORDER — PROPOFOL 10 MG/ML IV BOLUS
INTRAVENOUS | Status: DC | PRN
  Administered 2022-05-25: 200 mg via INTRAVENOUS

## 2022-05-25 MED ORDER — LIDOCAINE 2% (20 MG/ML) 5 ML SYRINGE
INTRAMUSCULAR | Status: AC
  Filled 2022-05-25: qty 5

## 2022-05-25 MED ORDER — POVIDONE-IODINE 10 % EX SWAB: 2.0000 | Freq: Once | CUTANEOUS | Status: DC

## 2022-05-25 MED ORDER — OXYCODONE HCL 5 MG/5ML PO SOLN: 5.0000 mg | Freq: Once | ORAL | Status: AC | PRN

## 2022-05-25 SURGICAL SUPPLY — 22 items
CATH ROBINSON RED A/P 16FR (CATHETERS) ×1 IMPLANT
FILTER UTR ASPR ASSEMBLY (MISCELLANEOUS) ×1 IMPLANT
GLOVE BIOGEL PI IND STRL 6.5 (GLOVE) ×1 IMPLANT
GLOVE BIOGEL PI IND STRL 7.0 (GLOVE) ×1 IMPLANT
GLOVE SURG SS PI 6.5 STRL IVOR (GLOVE) ×1 IMPLANT
GOWN STRL REUS W/ TWL LRG LVL3 (GOWN DISPOSABLE) ×2 IMPLANT
GOWN STRL REUS W/TWL LRG LVL3 (GOWN DISPOSABLE) ×2
HOSE CONNECTING 18IN BERKELEY (TUBING) ×1 IMPLANT
KIT BERKELEY 1ST TRI 3/8 NO TR (MISCELLANEOUS) ×1 IMPLANT
KIT BERKELEY 1ST TRIMESTER 3/8 (MISCELLANEOUS) ×1 IMPLANT
NS IRRIG 1000ML POUR BTL (IV SOLUTION) ×1 IMPLANT
PACK VAGINAL MINOR WOMEN LF (CUSTOM PROCEDURE TRAY) ×1 IMPLANT
PAD OB MATERNITY 4.3X12.25 (PERSONAL CARE ITEMS) ×1 IMPLANT
SET BERKELEY SUCTION TUBING (SUCTIONS) ×1 IMPLANT
SPIKE FLUID TRANSFER (MISCELLANEOUS) ×1 IMPLANT
TOWEL GREEN STERILE FF (TOWEL DISPOSABLE) ×2 IMPLANT
UNDERPAD 30X36 HEAVY ABSORB (UNDERPADS AND DIAPERS) ×1 IMPLANT
VACURETTE 10 RIGID CVD (CANNULA) IMPLANT
VACURETTE 6 ASPIR F TIP BERK (CANNULA) IMPLANT
VACURETTE 7MM CVD STRL WRAP (CANNULA) IMPLANT
VACURETTE 8 RIGID CVD (CANNULA) IMPLANT
VACURETTE 9 RIGID CVD (CANNULA) IMPLANT

## 2022-05-25 NOTE — Anesthesia Procedure Notes (Signed)
Procedure Name: LMA Insertion Date/Time: 05/25/2022 9:41 AM  Performed by: Cheree Ditto, CRNAPre-anesthesia Checklist: Patient identified, Emergency Drugs available, Suction available and Patient being monitored Patient Re-evaluated:Patient Re-evaluated prior to induction Oxygen Delivery Method: Circle system utilized Preoxygenation: Pre-oxygenation with 100% oxygen Induction Type: IV induction Ventilation: Mask ventilation without difficulty LMA: LMA with gastric port inserted LMA Size: 4.0 Number of attempts: 1 Placement Confirmation: breath sounds checked- equal and bilateral and positive ETCO2 Tube secured with: Tape

## 2022-05-25 NOTE — H&P (Signed)
Joanne Santos is an 31 y.o. female with abnormal uterine bleeding diagnosed with retained products of conception. Patient had a medical termination of pregnancy on 03/21/22 and reports on going light bleeding since. Patient denies pelvic pain. Pelvic ultrasound demonstrated findings concerning for retained products of conception. Patient here for scheduled dilatation and evacuation   Menstrual History: No LMP recorded.    Past Medical History:  Diagnosis Date   BV (bacterial vaginosis)    Complication of anesthesia 03/23/2015   itched all over   Febrile seizure (HCC)    child 5 or less- only 1 time   Herpes genitalia    05/24/22- no recent flares    Past Surgical History:  Procedure Laterality Date   CESAREAN SECTION  2016    Family History  Problem Relation Age of Onset   Early death Mother    Diabetes Father    Hypertension Father    Diabetes Maternal Grandmother    Stroke Maternal Grandmother    Hypertension Sister     Social History:  reports that she has never smoked. She has never used smokeless tobacco. She reports current alcohol use of about 6.0 standard drinks of alcohol per week. She reports that she does not use drugs.  Allergies: No Known Allergies  Medications Prior to Admission  Medication Sig Dispense Refill Last Dose   Phenylephrine-DM-GG-APAP (MUCINEX FAST-MAX COLD FLU) 5-10-200-325 MG/10ML LIQD Take 30 mLs by mouth 3 (three) times daily as needed (cold symptoms).   05/24/2022    Review of Systems See pertinent in HPI. All other systems reviewed and non contributory Blood pressure 115/60, pulse (!) 57, temperature 98.1 F (36.7 C), temperature source Oral, resp. rate 18, height 5\' 5"  (1.651 m), weight 81.6 kg, SpO2 99 %. Physical Exam GENERAL: Well-developed, well-nourished female in no acute distress.  LUNGS: Clear to auscultation bilaterally.  HEART: Regular rate and rhythm. ABDOMEN: Soft, nontender, nondistended. No organomegaly. PELVIC:  Deferred to OR EXTREMITIES: No cyanosis, clubbing, or edema, 2+ distal pulses.  Results for orders placed or performed during the hospital encounter of 05/25/22 (from the past 24 hour(s))  Type and screen [facility]     Status: None   Collection Time: 05/25/22  7:55 AM  Result Value Ref Range   ABO/RH(D) B POS    Antibody Screen NEG    Sample Expiration      05/28/2022,2359 Performed at [facility], [address], [place], [place]   ABO/Rh     Status: None (Preliminary result)   Collection Time: 05/25/22  8:00 AM  Result Value Ref Range   ABO/RH(D) PENDING   CBC per protocol     Status: Abnormal   Collection Time: 05/25/22  8:09 AM  Result Value Ref Range   WBC 3.3 (L) 4.0 - 10.5 K/uL   RBC 4.07 3.87 - 5.11 MIL/uL   Hemoglobin 13.2 12.0 - 15.0 g/dL   HCT 05/27/22 38.1 - 82.9 %   MCV 92.6 80.0 - 100.0 fL   MCH 32.4 26.0 - 34.0 pg   MCHC 35.0 30.0 - 36.0 g/dL   RDW 93.7 16.9 - 67.8 %   Platelets 239 150 - 400 K/uL   nRBC 0.0 0.0 - 0.2 %    No results found. 93.8 PELVIC COMPLETE WITH TRANSVAGINAL  Result Date: 05/17/2022 CLINICAL DATA:  Persistent vaginal bleeding and cramping after medical therapeutic abortion 03/21/2022; history Caesarean section, per patient negative pregnancy test recently at office visit EXAM: TRANSABDOMINAL AND  TRANSVAGINAL ULTRASOUND OF PELVIS TECHNIQUE: Both transabdominal and transvaginal ultrasound examinations of the pelvis were performed. Transabdominal technique was performed for global imaging of the pelvis including uterus, ovaries, adnexal regions, and pelvic cul-de-sac. It was necessary to proceed with endovaginal exam following the transabdominal exam to visualize the endometrium and ovaries. COMPARISON:  None Available. FINDINGS: Uterus Measurements: 10.6 x 3.9 x 5.4 cm = volume: 124 mL. Anteverted. Slightly heterogeneous myometrium. No mass. Endometrium Thickness: 13 mm. Abnormal focal heterogeneous echogenic material  containing internal blood flow on color Doppler imaging cine series at fundal aspect of endometrial canal measuring 21 x 12 x 20 mm highly suspicious for retained products of conception. No endometrial fluid. Right ovary Measurements: 3.6 x 2.0 x 2.5 cm = volume: 9.1 mL. Normal morphology without mass Left ovary Measurements: 3.8 x 2.0 x 1.9 cm = volume: 7.5 mL. Normal morphology without mass Other findings No free pelvic fluid or adnexal masses. IMPRESSION: Heterogeneous echogenic material at fundal aspect of endometrial canal 21 x 12 x 20 mm containing internal blood flow on color Doppler imaging highly suspicious for retained products of conception. Remainder of exam unremarkable. These results will be called to the ordering clinician or representative by the Radiologist Assistant, and communication documented in the PACS or [employer]. Electronically Signed   By: Lavonia Dana M.D.   On: 05/17/2022 14:04    Assessment/Plan: 31 yo with retained products of conception following a termination of pregnancy here for D&E - risks, benefits and alternatives were explained including but not limited to risks of bleeding, infection, uterine perforation, damage to adjacent organs. Patient verbalized understanding and all questions were answered - Patient ready to move forward with D&E  Marino Rogerson 05/25/2022, 8:55 AM

## 2022-05-25 NOTE — Transfer of Care (Signed)
Immediate Anesthesia Transfer of Care Note  Patient: Joanne Santos  Procedure(s) Performed: DILATATION AND EVACUATION  Patient Location: PACU  Anesthesia Type:General  Level of Consciousness: awake, alert , and oriented  Airway & Oxygen Therapy: Patient Spontanous Breathing  Post-op Assessment: Report given to RN and Post -op Vital signs reviewed and stable  Post vital signs: Reviewed and stable  Last Vitals:  Vitals Value Taken Time  BP 120/88 05/25/22 1010  Temp    Pulse 70 05/25/22 1012  Resp 17 05/25/22 1012  SpO2 100 % 05/25/22 1012  Vitals shown include unvalidated device data.  Last Pain:  Vitals:   05/25/22 0718  TempSrc: Oral         Complications: No notable events documented.

## 2022-05-25 NOTE — Anesthesia Postprocedure Evaluation (Signed)
Anesthesia Post Note  Patient: Joanne Santos  Procedure(s) Performed: DILATATION AND EVACUATION     Patient location during evaluation: PACU Anesthesia Type: General Level of consciousness: awake and alert Pain management: pain level controlled Vital Signs Assessment: post-procedure vital signs reviewed and stable Respiratory status: spontaneous breathing, nonlabored ventilation and respiratory function stable Cardiovascular status: stable and blood pressure returned to baseline Anesthetic complications: no   No notable events documented.  Last Vitals:  Vitals:   05/25/22 1115 05/25/22 1130  BP: 105/69 102/62  Pulse: (!) 45 (!) 49  Resp: (!) 22 14  Temp:  36.8 C  SpO2: 98% 100%    Last Pain:  Vitals:   05/25/22 1130  TempSrc:   PainSc: 4                  Beryle Lathe

## 2022-05-25 NOTE — Anesthesia Preprocedure Evaluation (Addendum)
Anesthesia Evaluation  Patient identified by MRN, date of birth, ID band Patient awake    Reviewed: Allergy & Precautions, NPO status , Patient's Chart, lab work & pertinent test results  History of Anesthesia Complications Negative for: history of anesthetic complications  Airway Mallampati: II  TM Distance: >3 FB Neck ROM: Full    Dental  (+) Dental Advisory Given, Teeth Intact   Pulmonary neg pulmonary ROS   Pulmonary exam normal        Cardiovascular negative cardio ROS Normal cardiovascular exam     Neuro/Psych  Febrile seizure x 1 as a child   negative psych ROS   GI/Hepatic negative GI ROS, Neg liver ROS,,,  Endo/Other  negative endocrine ROS    Renal/GU negative Renal ROS     Musculoskeletal negative musculoskeletal ROS (+)    Abdominal   Peds  Hematology negative hematology ROS (+)   Anesthesia Other Findings HSV  Reproductive/Obstetrics  Missed abortion                              Anesthesia Physical Anesthesia Plan  ASA: 1  Anesthesia Plan: General   Post-op Pain Management: Tylenol PO (pre-op)* and Celebrex PO (pre-op)*   Induction: Intravenous  PONV Risk Score and Plan: 3 and Treatment may vary due to age or medical condition, Ondansetron, Dexamethasone and Midazolam  Airway Management Planned: LMA  Additional Equipment: None  Intra-op Plan:   Post-operative Plan: Extubation in OR  Informed Consent: I have reviewed the patients History and Physical, chart, labs and discussed the procedure including the risks, benefits and alternatives for the proposed anesthesia with the patient or authorized representative who has indicated his/her understanding and acceptance.     Dental advisory given  Plan Discussed with: CRNA and Anesthesiologist  Anesthesia Plan Comments:        Anesthesia Quick Evaluation

## 2022-05-25 NOTE — Op Note (Signed)
Joanne Santos PROCEDURE DATE: 05/25/2022  PREOPERATIVE DIAGNOSIS: Retained products of conception. POSTOPERATIVE DIAGNOSIS: The same. PROCEDURE:     Dilation and Evacuation. SURGEON:  Dr. Catalina Antigua  INDICATIONS: 31 y.o. with retained products of conception, needing surgical completion.  Risks of surgery were discussed with the patient including but not limited to: bleeding which may require transfusion; infection which may require antibiotics; injury to uterus or surrounding organs;need for additional procedures including laparotomy or laparoscopy; possibility of intrauterine scarring which may impair future fertility; and other postoperative/anesthesia complications. Written informed consent was obtained.    FINDINGS:  A 10-week size anteverted uterus, moderate amounts of products of conception, specimen sent to pathology.  ANESTHESIA:    Monitored intravenous sedation, paracervical block. INTRAVENOUS FLUIDS:  400 ml of LR ESTIMATED BLOOD LOSS:  Less than 20 ml. SPECIMENS:  Products of conception sent to pathology COMPLICATIONS:  None immediate.  PROCEDURE DETAILS:  The patient received intravenous antibiotics while in the preoperative area.  She was then taken to the operating room where general anesthesia was administered and was found to be adequate.  After an adequate timeout was performed, she was placed in the dorsal lithotomy position and examined; then prepped and draped in the sterile manner. A vaginal speculum was then placed in the patient's vagina and a single tooth tenaculum was applied to the anterior lip of the cervix.  A paracervical block using 0.5% Marcaine was administered. The cervix was gently dilated to accommodate a 10 mm suction curette that was gently advanced to the uterine fundus.  The suction device was then activated and curette slowly rotated to clear the uterus of products of conception.  A sharp curettage was then performed to confirm complete emptying of the  uterus. There was minimal bleeding noted and the tenaculum removed with good hemostasis noted.   All instruments were removed from the patient's vagina. The patient tolerated the procedure well and was taken to the recovery area awake, and in stable condition.  The patient will be discharged to home as per PACU criteria.  Routine postoperative instructions given.  She will follow up in the clinic in 2 weeks for postoperative evaluation.

## 2022-05-26 ENCOUNTER — Encounter (HOSPITAL_COMMUNITY): Payer: Self-pay | Admitting: Obstetrics and Gynecology

## 2022-05-27 LAB — SURGICAL PATHOLOGY

## 2022-06-03 ENCOUNTER — Other Ambulatory Visit: Payer: Self-pay | Admitting: Family Medicine

## 2022-06-09 ENCOUNTER — Ambulatory Visit (INDEPENDENT_AMBULATORY_CARE_PROVIDER_SITE_OTHER): Payer: Medicaid Other | Admitting: Obstetrics and Gynecology

## 2022-06-09 ENCOUNTER — Encounter: Payer: Self-pay | Admitting: Obstetrics and Gynecology

## 2022-06-09 ENCOUNTER — Other Ambulatory Visit (HOSPITAL_COMMUNITY)
Admission: RE | Admit: 2022-06-09 | Discharge: 2022-06-09 | Disposition: A | Discharge status: Home / Self Care | Payer: Medicaid Other | Source: Ambulatory Visit | Attending: Obstetrics and Gynecology | Admitting: Obstetrics and Gynecology

## 2022-06-09 VITALS — BP 117/75 | HR 58 | Wt 182.0 lb

## 2022-06-09 DIAGNOSIS — N760 Acute vaginitis: Secondary | ICD-10-CM

## 2022-06-09 DIAGNOSIS — Z9889 Other specified postprocedural states: Secondary | ICD-10-CM | POA: Diagnosis not present

## 2022-06-09 NOTE — Progress Notes (Signed)
Pt is in office for post op visit. Pt states she is doing well post op.  Pt states she is having some vaginal irritation and would like exam today.

## 2022-06-09 NOTE — Progress Notes (Signed)
31 yo s/p D&E for retained products of conception on 05/25/22 here for post op check. Patient reports feeling well with minimal bleeding. She is not currently sexually active and is not interested in contraception. She admits to some vaginal irritation. She completed treatment for BV and trichomonas prior to surgery. She is without any other complaints  Past Medical History:  Diagnosis Date   BV (bacterial vaginosis)    Complication of anesthesia 03/23/2015   itched all over   Febrile seizure (HCC)    child 5 or less- only 1 time   Herpes genitalia    05/24/22- no recent flares   Past Surgical History:  Procedure Laterality Date   CESAREAN SECTION  2016   DILATION AND EVACUATION N/A 05/25/2022   Procedure: DILATATION AND EVACUATION;  Surgeon: Catalina Antigua, MD;  Location: [facility] OR;  Service: Gynecology;  Laterality: N/A;   Family History  Problem Relation Age of Onset   Early death Mother    Diabetes Father    Hypertension Father    Diabetes Maternal Grandmother    Stroke Maternal Grandmother    Hypertension Sister    Social History   Tobacco Use   Smoking status: Never   Smokeless tobacco: Never  Vaping Use   Vaping Use: Never used  Substance Use Topics   Alcohol use: Yes    Alcohol/week: 6.0 standard drinks of alcohol    Types: 3 Glasses of wine, 3 Shots of liquor per week    Comment: weekly   Drug use: No   ROS See pertinent in HPI. All other systems reviewed and non contributory Blood pressure 117/75, pulse (!) 58, weight 182 lb (82.6 kg). GENERAL: Well-developed, well-nourished female in no acute distress.  ABDOMEN: Soft, nontender, nondistended. No organomegaly. PELVIC: Normal external female genitalia. Vagina is pink and rugated.  Normal discharge. Normal appearing cervix. Uterus is normal in size. No adnexal mass or tenderness. Chaperone present during the pelvic exam EXTREMITIES: No cyanosis, clubbing, or edema, 2+ distal pulses.  A/P 31 yo here for post op  check and vaginitis - vaginal swab collected - Pathology results reviewed with the patient - Patient will be contacted with abnormal results - Patient with normal pap smear 02/2022

## 2022-06-10 LAB — CERVICOVAGINAL ANCILLARY ONLY
Bacterial Vaginitis (gardnerella): NEGATIVE
Candida Glabrata: NEGATIVE
Candida Vaginitis: POSITIVE — AB
Chlamydia: NEGATIVE
Comment: NEGATIVE
Comment: NEGATIVE
Comment: NEGATIVE
Comment: NEGATIVE
Comment: NEGATIVE
Comment: NORMAL
Neisseria Gonorrhea: NEGATIVE
Trichomonas: NEGATIVE

## 2022-06-13 MED ORDER — FLUCONAZOLE 150 MG PO TABS: 150.0000 mg | ORAL_TABLET | Freq: Once | ORAL | 0 refills | Status: AC

## 2022-06-13 NOTE — Addendum Note (Signed)
Addended by: Catalina Antigua on: 06/13/2022 09:25 AM   Modules accepted: Orders

## 2022-06-29 DIAGNOSIS — S63501A Unspecified sprain of right wrist, initial encounter: Secondary | ICD-10-CM | POA: Diagnosis not present

## 2022-06-30 ENCOUNTER — Other Ambulatory Visit (HOSPITAL_COMMUNITY)
Admission: RE | Admit: 2022-06-30 | Discharge: 2022-06-30 | Disposition: A | Discharge status: Home / Self Care | Payer: Medicaid Other | Source: Ambulatory Visit | Attending: Obstetrics and Gynecology | Admitting: Obstetrics and Gynecology

## 2022-06-30 ENCOUNTER — Ambulatory Visit (INDEPENDENT_AMBULATORY_CARE_PROVIDER_SITE_OTHER): Payer: Medicaid Other | Admitting: General Practice

## 2022-06-30 VITALS — BP 97/56 | HR 53 | Ht 65.0 in | Wt 182.7 lb

## 2022-06-30 DIAGNOSIS — B9689 Other specified bacterial agents as the cause of diseases classified elsewhere: Secondary | ICD-10-CM | POA: Diagnosis not present

## 2022-06-30 DIAGNOSIS — N760 Acute vaginitis: Secondary | ICD-10-CM | POA: Diagnosis not present

## 2022-06-30 NOTE — Progress Notes (Signed)
Patient was assessed and managed by nursing staff during this encounter. I have reviewed the chart and agree with the documentation and plan. I have also made any necessary editorial changes.  Shahir Karen A Mardelle Pandolfi, MD 06/30/2022 3:08 PM   

## 2022-06-30 NOTE — Progress Notes (Signed)
SUBJECTIVE:  31 y.o. female complains of clear and malodorous vaginal discharge for 1 day(s). Denies abnormal vaginal bleeding or significant pelvic pain or fever. No UTI symptoms. Denies history of known exposure to STD.  No LMP recorded.  OBJECTIVE:  She appears well, afebrile. Urine dipstick: not done.  ASSESSMENT:  Vaginal Discharge  Vaginal Odor   PLAN:  GC, chlamydia, trichomonas, BVAG, CVAG probe sent to lab. Treatment: To be determined once lab results are received ROV prn if symptoms persist or worsen.

## 2022-07-01 LAB — CERVICOVAGINAL ANCILLARY ONLY
Bacterial Vaginitis (gardnerella): POSITIVE — AB
Candida Glabrata: NEGATIVE
Candida Vaginitis: NEGATIVE
Chlamydia: NEGATIVE
Comment: NEGATIVE
Comment: NEGATIVE
Comment: NEGATIVE
Comment: NEGATIVE
Comment: NEGATIVE
Comment: NORMAL
Neisseria Gonorrhea: NEGATIVE
Trichomonas: NEGATIVE

## 2022-07-06 ENCOUNTER — Other Ambulatory Visit: Payer: Self-pay | Admitting: *Deleted

## 2022-07-06 DIAGNOSIS — B9689 Other specified bacterial agents as the cause of diseases classified elsewhere: Secondary | ICD-10-CM

## 2022-07-06 MED ORDER — METRONIDAZOLE 500 MG PO TABS: 500.0000 mg | ORAL_TABLET | Freq: Two times a day (BID) | ORAL | 0 refills | Status: DC

## 2022-07-06 NOTE — Progress Notes (Signed)
TC. No answer. Left HIPAA compliant VM indicating message would be sent via MyChart. Call back number provided. MyChart message regarding BV, RX and pt education sent.

## 2022-07-07 ENCOUNTER — Telehealth: Payer: Self-pay | Admitting: Emergency Medicine

## 2022-07-07 NOTE — Telephone Encounter (Signed)
-----   Message from Penny Pia, RN sent at 07/06/2022  4:50 PM EST ----- TC. No answer. Left HIPAA compliant VM indicating message would be sent via MyChart. Call back number provided. MyChart message regarding BV, RX and pt education sent.

## 2022-07-07 NOTE — Telephone Encounter (Signed)
2nd attempt to call patient and LVM. Patient viewed results via [facility]. MyChart message sent with results, info. Rx sent to pharmacy.

## 2022-07-11 ENCOUNTER — Other Ambulatory Visit: Payer: Self-pay | Admitting: Emergency Medicine

## 2022-07-11 MED ORDER — FLUCONAZOLE 150 MG PO TABS: 150.0000 mg | ORAL_TABLET | Freq: Once | ORAL | 1 refills | Status: AC

## 2022-09-23 ENCOUNTER — Telehealth: Payer: Self-pay | Admitting: Family Medicine

## 2022-09-23 ENCOUNTER — Other Ambulatory Visit: Payer: Self-pay

## 2022-09-23 DIAGNOSIS — Z111 Encounter for screening for respiratory tuberculosis: Secondary | ICD-10-CM

## 2022-09-23 NOTE — Telephone Encounter (Signed)
Lab has been placed for pt and pt has been made aware to contact the office to schedule lab appt.

## 2022-09-23 NOTE — Telephone Encounter (Signed)
Pt states she needs a blood TB test to be done for school. Please advise.

## 2022-09-26 ENCOUNTER — Other Ambulatory Visit: Payer: Medicaid Other

## 2022-09-26 DIAGNOSIS — Z111 Encounter for screening for respiratory tuberculosis: Secondary | ICD-10-CM

## 2022-09-28 LAB — QUANTIFERON-TB GOLD PLUS
Mitogen-NIL: 8.81 IU/mL
NIL: 0.03 IU/mL
QuantiFERON-TB Gold Plus: NEGATIVE
TB1-NIL: 0.03 IU/mL
TB2-NIL: 0.01 IU/mL

## 2022-09-29 NOTE — Progress Notes (Signed)
Please call patient: I have reviewed his/her lab results. Negative TB screen

## 2022-10-13 ENCOUNTER — Other Ambulatory Visit (HOSPITAL_COMMUNITY)
Admission: RE | Admit: 2022-10-13 | Discharge: 2022-10-13 | Disposition: A | Discharge status: Home / Self Care | Payer: Medicaid Other | Source: Ambulatory Visit | Attending: Student | Admitting: Student

## 2022-10-13 ENCOUNTER — Ambulatory Visit (INDEPENDENT_AMBULATORY_CARE_PROVIDER_SITE_OTHER): Payer: Medicaid Other

## 2022-10-13 DIAGNOSIS — N898 Other specified noninflammatory disorders of vagina: Secondary | ICD-10-CM

## 2022-10-13 DIAGNOSIS — Z113 Encounter for screening for infections with a predominantly sexual mode of transmission: Secondary | ICD-10-CM

## 2022-10-13 MED ORDER — FLUCONAZOLE 150 MG PO TABS
150.0000 mg | ORAL_TABLET | Freq: Once | ORAL | 0 refills | Status: AC
Start: 2022-10-13 — End: 2022-10-13

## 2022-10-13 NOTE — Progress Notes (Signed)
SUBJECTIVE:  32 y.o. female complains of white and thick vaginal discharge for 7 day(s). Denies abnormal vaginal bleeding or significant pelvic pain or fever. No UTI symptoms. Denies history of known exposure to STD.  No LMP recorded.  OBJECTIVE:  She appears well, afebrile. Urine dipstick: not done.  ASSESSMENT:  Vaginal Discharge  Vaginal Odor   PLAN:  GC, chlamydia, trichomonas, BVAG, CVAG probe sent to lab. Treatment: To be determined once lab results are received ROV prn if symptoms persist or worsen.

## 2022-10-14 LAB — HEPATITIS B SURFACE ANTIGEN: Hepatitis B Surface Ag: NEGATIVE

## 2022-10-14 LAB — RPR: RPR Ser Ql: NONREACTIVE

## 2022-10-14 LAB — HEPATITIS C ANTIBODY: Hep C Virus Ab: NONREACTIVE

## 2022-10-14 LAB — HIV ANTIBODY (ROUTINE TESTING W REFLEX): HIV Screen 4th Generation wRfx: NONREACTIVE

## 2022-10-18 LAB — CERVICOVAGINAL ANCILLARY ONLY
Bacterial Vaginitis (gardnerella): POSITIVE — AB
Candida Glabrata: NEGATIVE
Candida Vaginitis: NEGATIVE
Chlamydia: NEGATIVE
Comment: NEGATIVE
Comment: NEGATIVE
Comment: NEGATIVE
Comment: NEGATIVE
Comment: NEGATIVE
Comment: NORMAL
Neisseria Gonorrhea: NEGATIVE
Trichomonas: NEGATIVE

## 2022-10-20 ENCOUNTER — Other Ambulatory Visit: Payer: Self-pay | Admitting: Obstetrics & Gynecology

## 2022-10-20 ENCOUNTER — Other Ambulatory Visit: Payer: Self-pay | Admitting: Emergency Medicine

## 2022-10-20 MED ORDER — METRONIDAZOLE 500 MG PO TABS: 500.0000 mg | ORAL_TABLET | Freq: Two times a day (BID) | ORAL | 0 refills | Status: DC

## 2022-10-20 NOTE — Progress Notes (Signed)
Attempted TC to patient. LVM to RC to clinic. Rx sent to pharmacy.

## 2022-10-24 ENCOUNTER — Other Ambulatory Visit: Payer: Self-pay

## 2022-10-24 DIAGNOSIS — N760 Acute vaginitis: Secondary | ICD-10-CM

## 2022-10-24 MED ORDER — METRONIDAZOLE 0.75 % VA GEL
1.0000 | Freq: Every day | VAGINAL | 1 refills | Status: DC
Start: 2022-10-24 — End: 2023-04-28

## 2022-11-07 ENCOUNTER — Other Ambulatory Visit: Payer: Self-pay

## 2022-11-07 ENCOUNTER — Telehealth: Payer: Self-pay | Admitting: Family Medicine

## 2022-11-07 DIAGNOSIS — Z0184 Encounter for antibody response examination: Secondary | ICD-10-CM

## 2022-11-07 NOTE — Telephone Encounter (Signed)
Patient states: - School requires she get Tdap vaccination since she is due  - She doesn't believe in taking vaccinations; to avoid this she needs Tdap titer test   Patient requests pcp to order this lab for completion.

## 2022-11-08 ENCOUNTER — Other Ambulatory Visit: Payer: Medicaid Other

## 2022-11-09 ENCOUNTER — Other Ambulatory Visit: Payer: Medicaid Other

## 2022-11-09 DIAGNOSIS — Z0184 Encounter for antibody response examination: Secondary | ICD-10-CM

## 2022-11-11 LAB — DIPHTHERIA / TETANUS ANTIBODY PANEL
Diphtheria Ab: 0.53 IU/mL
Tetanus Toxin Antibody, Total: 1.45 IU/mL

## 2022-11-14 ENCOUNTER — Encounter (INDEPENDENT_AMBULATORY_CARE_PROVIDER_SITE_OTHER): Payer: Medicaid Other

## 2022-11-14 DIAGNOSIS — Z0184 Encounter for antibody response examination: Secondary | ICD-10-CM

## 2022-11-15 ENCOUNTER — Encounter: Payer: Self-pay | Admitting: Family Medicine

## 2022-11-15 NOTE — Telephone Encounter (Signed)
Patient states MyChart results does not provide the information needed for school. Requests test results be printed out with Dr. Modesta Messing name on it and it states Patient is immune and the name of the test.  Requests to be called when ready for Patient to pick up.

## 2022-11-21 NOTE — Telephone Encounter (Signed)
Patient states her school requires Pertussis parameter results be on the Letter. Patient requests to be advised.

## 2022-11-24 ENCOUNTER — Other Ambulatory Visit: Payer: Self-pay

## 2022-11-25 DIAGNOSIS — Z0184 Encounter for antibody response examination: Secondary | ICD-10-CM

## 2022-11-25 NOTE — Telephone Encounter (Signed)
A total of 9 minutes were spent by me to personally review the patient-generated inquiry, review patient records and data pertinent to assessment of the patient's problem, develop a management plan including generation of prescriptions and/or orders, and on subsequent communication with the patient through secure the MyChart portal service. There is no separately reported E/M service related to this service in the past 7 days nor does the patient have an upcoming soonest available appointment for this issue. This work was completed in less than 7 days.   This review has been ongoing and now new testing requested. I reviewed her notes and lab results, ordered new test and contacted pat.

## 2022-12-01 ENCOUNTER — Other Ambulatory Visit: Payer: Medicaid Other

## 2023-02-14 DIAGNOSIS — L658 Other specified nonscarring hair loss: Secondary | ICD-10-CM | POA: Insufficient documentation

## 2023-02-27 ENCOUNTER — Encounter: Payer: Medicaid Other | Admitting: Family Medicine

## 2023-03-03 ENCOUNTER — Ambulatory Visit: Payer: Medicaid Other | Admitting: Family Medicine

## 2023-04-28 ENCOUNTER — Ambulatory Visit (INDEPENDENT_AMBULATORY_CARE_PROVIDER_SITE_OTHER): Payer: Medicaid Other | Admitting: Family Medicine

## 2023-04-28 ENCOUNTER — Encounter: Payer: Self-pay | Admitting: Family Medicine

## 2023-04-28 VITALS — BP 122/62 | HR 79 | Temp 98.0°F | Ht 65.0 in | Wt 186.4 lb

## 2023-04-28 DIAGNOSIS — Z3009 Encounter for other general counseling and advice on contraception: Secondary | ICD-10-CM

## 2023-04-28 DIAGNOSIS — Z0001 Encounter for general adult medical examination with abnormal findings: Secondary | ICD-10-CM

## 2023-04-28 DIAGNOSIS — N760 Acute vaginitis: Secondary | ICD-10-CM

## 2023-04-28 DIAGNOSIS — R350 Frequency of micturition: Secondary | ICD-10-CM

## 2023-04-28 DIAGNOSIS — B9689 Other specified bacterial agents as the cause of diseases classified elsewhere: Secondary | ICD-10-CM | POA: Diagnosis not present

## 2023-04-28 DIAGNOSIS — J029 Acute pharyngitis, unspecified: Secondary | ICD-10-CM | POA: Diagnosis not present

## 2023-04-28 DIAGNOSIS — Z13220 Encounter for screening for lipoid disorders: Secondary | ICD-10-CM

## 2023-04-28 DIAGNOSIS — Z113 Encounter for screening for infections with a predominantly sexual mode of transmission: Secondary | ICD-10-CM

## 2023-04-28 LAB — CBC WITH DIFFERENTIAL/PLATELET
Basophils Absolute: 0 10*3/uL (ref 0.0–0.1)
Basophils Relative: 0.1 % (ref 0.0–3.0)
Eosinophils Absolute: 0 10*3/uL (ref 0.0–0.7)
Eosinophils Relative: 0.2 % (ref 0.0–5.0)
HCT: 39.9 % (ref 36.0–46.0)
Hemoglobin: 12.9 g/dL (ref 12.0–15.0)
Lymphocytes Relative: 32.6 % (ref 12.0–46.0)
Lymphs Abs: 4.1 10*3/uL — ABNORMAL HIGH (ref 0.7–4.0)
MCHC: 32.2 g/dL (ref 30.0–36.0)
MCV: 94.1 fL (ref 78.0–100.0)
Monocytes Absolute: 2 10*3/uL — ABNORMAL HIGH (ref 0.1–1.0)
Monocytes Relative: 16.1 % — ABNORMAL HIGH (ref 3.0–12.0)
Neutro Abs: 6.5 10*3/uL (ref 1.4–7.7)
Neutrophils Relative %: 51 % (ref 43.0–77.0)
Platelets: 279 10*3/uL (ref 150.0–400.0)
RBC: 4.24 Mil/uL (ref 3.87–5.11)
RDW: 13 % (ref 11.5–15.5)
WBC: 12.7 10*3/uL — ABNORMAL HIGH (ref 4.0–10.5)

## 2023-04-28 LAB — COMPREHENSIVE METABOLIC PANEL
ALT: 15 U/L (ref 0–35)
AST: 11 U/L (ref 0–37)
Albumin: 3.8 g/dL (ref 3.5–5.2)
Alkaline Phosphatase: 34 U/L — ABNORMAL LOW (ref 39–117)
BUN: 14 mg/dL (ref 6–23)
CO2: 25 meq/L (ref 19–32)
Calcium: 8.8 mg/dL (ref 8.4–10.5)
Chloride: 102 meq/L (ref 96–112)
Creatinine, Ser: 0.67 mg/dL (ref 0.40–1.20)
GFR: 117.52 mL/min (ref >60.00)
Glucose, Bld: 91 mg/dL (ref 70–99)
Potassium: 3.5 meq/L (ref 3.5–5.1)
Sodium: 136 meq/L (ref 135–145)
Total Bilirubin: 0.2 mg/dL (ref 0.2–1.2)
Total Protein: 7.2 g/dL (ref 6.0–8.3)

## 2023-04-28 LAB — LIPID PANEL
Cholesterol: 149 mg/dL (ref 0–200)
HDL: 41.1 mg/dL (ref >39.00)
LDL Cholesterol: 91 mg/dL (ref 0–99)
NonHDL: 108.24
Total CHOL/HDL Ratio: 4
Triglycerides: 87 mg/dL (ref 0.0–149.0)
VLDL: 17.4 mg/dL (ref 0.0–40.0)

## 2023-04-28 LAB — TSH: TSH: 1.25 u[IU]/mL (ref 0.35–5.50)

## 2023-04-28 NOTE — Patient Instructions (Signed)
Please return in 12 months for your annual complete physical; please come fasting.   I will release your lab results to you on your MyChart account with further instructions. You may see the results before I do, but when I review them I will send you a message with my report or have my assistant call you if things need to be discussed. Please reply to my message with any questions. Thank you!   If you have any questions or concerns, please don't hesitate to send me a message via MyChart or call the office at [phone]. Thank you for visiting with us today! It's our pleasure caring for you.  

## 2023-04-28 NOTE — Progress Notes (Signed)
Subjective  Chief Complaint  Patient presents with   Annual Exam    Pt states that she is here for annual exam w/o any complaints - non fasting     HPI: Joanne Santos is a 32 y.o. female who presents to [facility] today for a Female Wellness Visit.  She also has the concerns and/or needs as listed above in the chief complaint. These will be addressed in addition to the Health Maintenance Visit.   Wellness Visit: annual visit with health maintenance review and exam HM: screens are current. Feels well. Now in nursing school full time. Was able to quit job 2 weeks ago. Feels relieved. Has a steady boyfriend now. Using "calendar method" for birth control. Declines flu vaccine Chronic disease management visit and/or acute problem visit: H/o BV and trich; for std screen today. No sxs. Rare condom use.  Declines all birth control at this time. Just "doesn't want it". Understands limitations of calendar method and intermittent condom use.  C/o urinary frequency over the last few weeks with an occasional episode of incontinence. No dysuria. No vaginal sxs/d/c On amox for recent recurrent strep. Went to UC: no records available for review Has h/o recurrent BV  Assessment  1. Encounter for well adult exam with abnormal findings   2. Screen for STD (sexually transmitted disease)   3. Urinary frequency   4. Birth control counseling   5. Sore throat   6. Bacterial vaginosis      Plan  Female Wellness Visit: Age appropriate Health Maintenance and Prevention measures were discussed with patient. Included topics are cancer screening recommendations, ways to keep healthy (see AVS) including dietary and exercise recommendations, regular eye and dental care, use of seat belts, and avoidance of moderate alcohol use and tobacco use. Screens are up to date BMI: discussed patient's BMI and encouraged positive lifestyle modifications to help get to or maintain a target BMI. HM  needs and immunizations were addressed and ordered. See below for orders. See HM and immunization section for updates. Declines flu vaccine Routine labs and screening tests ordered including cmp, cbc and lipids where appropriate. Discussed recommendations regarding Vit D and calcium supplementation (see AVS)  Chronic disease f/u and/or acute problem visit: (deemed necessary to be done in addition to the wellness visit): STD risk:  discussed std prevention. Send in screens today.  Birth control counseling done.  Discussed h/o recurrent sore throats. If strep positive recurrently over winter will refer to ENT. Pt completing amox now Urinary frequency: check urine culture.  Recurrent BV; recent treatment completed.   Follow up: 12 mo for cpe  Orders Placed This Encounter  Procedures   Urine Culture   CBC with Differential/Platelet   Comprehensive metabolic panel   Lipid panel   TSH   HIV antibody (with reflex)   No orders of the defined types were placed in this encounter.      Body mass index is 31.02 kg/m. Wt Readings from Last 3 Encounters:  04/28/23 186 lb 6.4 oz (84.6 kg)  06/30/22 182 lb 11.2 oz (82.9 kg)  06/09/22 182 lb (82.6 kg)    Patient Active Problem List   Diagnosis Date Noted Date Diagnosed   Genital herpes 10/07/2016     Overview:  By pcr from lesion    Bacterial vaginosis 09/19/2016    Health Maintenance  Topic Date Due   COVID-19 Vaccine (3 - 2023-24 season) 05/14/2023 (Originally 03/05/2023)   DTaP/Tdap/Td (2 - Td  or Tdap) 04/27/2024 (Originally 12/18/2022)   Cervical Cancer Screening (HPV/Pap Cotest)  02/26/2027   HPV VACCINES  Completed   Hepatitis C Screening  Completed   HIV Screening  Completed   Immunization History  Administered Date(s) Administered   HPV Quadrivalent 12/14/2007, 11/03/2009, 12/17/2012   Influenza,inj,Quad PF,6+ Mos 02/24/2020   Moderna Sars-Covid-2 Vaccination 02/24/2020, 03/23/2020   Tdap 12/17/2012   We updated and  reviewed the patient's past history in detail and it is documented below. Allergies: Patient  reports current alcohol use of about 6.0 standard drinks of alcohol per week. Past Medical History Patient  has a past medical history of BV (bacterial vaginosis), Complication of anesthesia (03/23/2015), and Herpes genitalia. Past Surgical History Patient  has a past surgical history that includes Cesarean section (2016) and Dilation and evacuation (N/A, 05/25/2022). Social History   Socioeconomic History   Marital status: Single    Spouse name: Not on file   Number of children: Not on file   Years of education: Not on file   Highest education level: Not on file  Occupational History   Not on file  Tobacco Use   Smoking status: Never   Smokeless tobacco: Never  Vaping Use   Vaping status: Never Used  Substance and Sexual Activity   Alcohol use: Yes    Alcohol/week: 6.0 standard drinks of alcohol    Types: 3 Glasses of wine, 3 Shots of liquor per week    Comment: weekly   Drug use: No   Sexual activity: Yes    Birth control/protection: Implant  Other Topics Concern   Not on file  Social History Narrative   Not on file   Social Determinants of Health   Financial Resource Strain: Not on file  Food Insecurity: Not on file  Transportation Needs: Not on file  Physical Activity: Not on file  Stress: Not on file  Social Connections: Unknown (11/15/2021)   Received from [employer], [facility]   Social Network    Social Network: Not on file   Family History  Problem Relation Age of Onset   Early death Mother    Diabetes Father    Hypertension Father    Diabetes Maternal Grandmother    Stroke Maternal Grandmother    Hypertension Sister     Review of Systems: Constitutional: negative for fever or malaise Ophthalmic: negative for photophobia, double vision or loss of vision Cardiovascular: negative for chest pain, dyspnea on exertion, or new LE swelling Respiratory:  negative for SOB or persistent cough Gastrointestinal: negative for abdominal pain, change in bowel habits or melena Genitourinary: negative for dysuria or gross hematuria, no abnormal uterine bleeding or disharge Musculoskeletal: negative for new gait disturbance or muscular weakness Integumentary: negative for new or persistent rashes, no breast lumps Neurological: negative for TIA or stroke symptoms Psychiatric: negative for SI or delusions Allergic/Immunologic: negative for hives  Patient Care Team    Relationship Specialty Notifications Start End  Willow Ora, MD PCP - General Family Medicine  04/11/17   Basilio Cairo, MD Consulting Physician Dermatology  04/28/23     Objective  Vitals: BP 122/62   Pulse 79   Temp 98 F (36.7 C)   Ht 5\' 5"  (1.651 m)   Wt 186 lb 6.4 oz (84.6 kg)   SpO2 97%   BMI 31.02 kg/m  General:  Well developed, well nourished, no acute distress  Psych:  Alert and orientedx3,normal mood and affect HEENT:  Normocephalic, atraumatic, non-icteric sclera, PERRL, supple  neck without adenopathy, mass or thyromegaly Cardiovascular:  Normal S1, S2, RRR without gallop, rub or murmur Respiratory:  Good breath sounds bilaterally, CTAB with normal respiratory effort Gastrointestinal: normal bowel sounds, soft, non-tender, no noted masses. No HSM MSK: no deformities, contusions. Joints are without erythema or swelling.  Skin:  Warm, no rashes or suspicious lesions noted Neurologic:    Mental status is normal. Gross motor and sensory exams are normal. Normal gait. No tremor    Commons side effects, risks, benefits, and alternatives for medications and treatment plan prescribed today were discussed, and the patient expressed understanding of the given instructions. Patient is instructed to call or message via MyChart if he/she has any questions or concerns regarding our treatment plan. No barriers to understanding were identified. We discussed Red Flag symptoms  and signs in detail. Patient expressed understanding regarding what to do in case of urgent or emergency type symptoms.  Medication list was reconciled, printed and provided to the patient in AVS. Patient instructions and summary information was reviewed with the patient as documented in the AVS. This note was prepared with assistance of Dragon voice recognition software. Occasional wrong-word or sound-a-like substitutions may have occurred due to the inherent limitations of voice recognition software .

## 2023-04-29 LAB — URINE CULTURE
MICRO NUMBER:: 15644614
Result:: NO GROWTH
SPECIMEN QUALITY:: ADEQUATE

## 2023-04-29 LAB — HIV ANTIBODY (ROUTINE TESTING W REFLEX): HIV 1&2 Ab, 4th Generation: NONREACTIVE

## 2023-05-05 NOTE — Progress Notes (Signed)
See mychart note Nl labs; leukocytosis due to strep.  Awaiting urine STD screen results.

## 2023-05-26 ENCOUNTER — Other Ambulatory Visit: Payer: Self-pay | Admitting: Obstetrics and Gynecology

## 2023-07-17 ENCOUNTER — Ambulatory Visit: Payer: Medicaid Other

## 2023-07-18 ENCOUNTER — Other Ambulatory Visit (HOSPITAL_COMMUNITY)
Admission: RE | Admit: 2023-07-18 | Discharge: 2023-07-18 | Disposition: A | Discharge status: Home / Self Care | Payer: Medicaid Other | Source: Ambulatory Visit | Attending: Family Medicine | Admitting: Family Medicine

## 2023-07-18 ENCOUNTER — Ambulatory Visit: Payer: Medicaid Other

## 2023-07-18 VITALS — BP 108/62 | HR 77 | Ht 65.0 in | Wt 191.0 lb

## 2023-07-18 DIAGNOSIS — N898 Other specified noninflammatory disorders of vagina: Secondary | ICD-10-CM | POA: Diagnosis present

## 2023-07-18 NOTE — Progress Notes (Signed)
 SUBJECTIVE:  33 y.o. female complains of clear and white vaginal discharge for 3 week(s). Denies abnormal vaginal bleeding or significant pelvic pain or fever. No UTI symptoms. Denies history of known exposure to STD.  Patient's last menstrual period was 07/01/2023.  OBJECTIVE:  She appears well, afebrile. Urine dipstick: not done.  ASSESSMENT:  Vaginal Discharge  Vaginal Odor   PLAN:  GC, chlamydia, trichomonas, BVAG, CVAG probe sent to lab. Treatment: To be determined once lab results are received ROV prn if symptoms persist or worsen.

## 2023-07-19 LAB — CERVICOVAGINAL ANCILLARY ONLY
Bacterial Vaginitis (gardnerella): POSITIVE — AB
Candida Glabrata: NEGATIVE
Candida Vaginitis: NEGATIVE
Chlamydia: NEGATIVE
Comment: NEGATIVE
Comment: NEGATIVE
Comment: NEGATIVE
Comment: NEGATIVE
Comment: NEGATIVE
Comment: NORMAL
Neisseria Gonorrhea: NEGATIVE
Trichomonas: NEGATIVE

## 2023-07-20 MED ORDER — METRONIDAZOLE 500 MG PO TABS: 500.0000 mg | ORAL_TABLET | Freq: Two times a day (BID) | ORAL | 0 refills | Status: DC

## 2023-07-20 NOTE — Addendum Note (Signed)
Addended by: Celedonio Savage on: 07/20/2023 02:34 PM   Modules accepted: Orders

## 2023-07-21 ENCOUNTER — Encounter: Payer: Self-pay | Admitting: Family Medicine

## 2023-07-21 MED ORDER — NUVESSA 1.3 % VA GEL: 65.0000 mg | Freq: Once | VAGINAL | 0 refills | Status: AC

## 2023-08-16 ENCOUNTER — Ambulatory Visit: Payer: Medicaid Other | Admitting: Family Medicine

## 2023-08-16 ENCOUNTER — Encounter: Payer: Self-pay | Admitting: Family Medicine

## 2023-08-16 ENCOUNTER — Inpatient Hospital Stay (HOSPITAL_COMMUNITY)
Admission: AD | Admit: 2023-08-16 | Discharge: 2023-08-16 | Disposition: A | Discharge status: Home / Self Care | Payer: Medicaid Other | Attending: Obstetrics and Gynecology | Admitting: Obstetrics and Gynecology

## 2023-08-16 ENCOUNTER — Inpatient Hospital Stay (HOSPITAL_COMMUNITY): Payer: Medicaid Other

## 2023-08-16 ENCOUNTER — Encounter (HOSPITAL_COMMUNITY): Payer: Self-pay | Admitting: Obstetrics and Gynecology

## 2023-08-16 VITALS — BP 93/59 | HR 76 | Ht 65.0 in | Wt 195.0 lb

## 2023-08-16 DIAGNOSIS — N939 Abnormal uterine and vaginal bleeding, unspecified: Secondary | ICD-10-CM

## 2023-08-16 DIAGNOSIS — Z3491 Encounter for supervision of normal pregnancy, unspecified, first trimester: Secondary | ICD-10-CM

## 2023-08-16 DIAGNOSIS — O26891 Other specified pregnancy related conditions, first trimester: Secondary | ICD-10-CM | POA: Diagnosis not present

## 2023-08-16 DIAGNOSIS — Z3A01 Less than 8 weeks gestation of pregnancy: Secondary | ICD-10-CM

## 2023-08-16 DIAGNOSIS — O3680X0 Pregnancy with inconclusive fetal viability, not applicable or unspecified: Secondary | ICD-10-CM | POA: Diagnosis not present

## 2023-08-16 DIAGNOSIS — O26851 Spotting complicating pregnancy, first trimester: Secondary | ICD-10-CM | POA: Diagnosis present

## 2023-08-16 DIAGNOSIS — R1031 Right lower quadrant pain: Secondary | ICD-10-CM | POA: Diagnosis not present

## 2023-08-16 LAB — URINALYSIS, ROUTINE W REFLEX MICROSCOPIC
Bilirubin Urine: NEGATIVE
Glucose, UA: NEGATIVE mg/dL
Ketones, ur: NEGATIVE mg/dL
Leukocytes,Ua: NEGATIVE
Nitrite: NEGATIVE
Protein, ur: NEGATIVE mg/dL
Specific Gravity, Urine: 1.02 (ref 1.005–1.030)
pH: 8 (ref 5.0–8.0)

## 2023-08-16 LAB — CBC
HCT: 35.6 % — ABNORMAL LOW (ref 36.0–46.0)
Hemoglobin: 12.2 g/dL (ref 12.0–15.0)
MCH: 30.9 pg (ref 26.0–34.0)
MCHC: 34.3 g/dL (ref 30.0–36.0)
MCV: 90.1 fL (ref 80.0–100.0)
Platelets: 274 10*3/uL (ref 150–400)
RBC: 3.95 MIL/uL (ref 3.87–5.11)
RDW: 12.1 % (ref 11.5–15.5)
WBC: 4.7 10*3/uL (ref 4.0–10.5)
nRBC: 0 % (ref 0.0–0.2)

## 2023-08-16 LAB — COMPREHENSIVE METABOLIC PANEL
ALT: 13 U/L (ref 0–44)
AST: 16 U/L (ref 15–41)
Albumin: 3.9 g/dL (ref 3.5–5.0)
Alkaline Phosphatase: 26 U/L — ABNORMAL LOW (ref 38–126)
Anion gap: 7 (ref 5–15)
BUN: 9 mg/dL (ref 6–20)
CO2: 21 mmol/L — ABNORMAL LOW (ref 22–32)
Calcium: 8.7 mg/dL — ABNORMAL LOW (ref 8.9–10.3)
Chloride: 106 mmol/L (ref 98–111)
Creatinine, Ser: 0.63 mg/dL (ref 0.44–1.00)
GFR, Estimated: >60 mL/min (ref >60)
Glucose, Bld: 90 mg/dL (ref 70–99)
Potassium: 3.7 mmol/L (ref 3.5–5.1)
Sodium: 134 mmol/L — ABNORMAL LOW (ref 135–145)
Total Bilirubin: 0.2 mg/dL (ref 0.0–1.2)
Total Protein: 7.2 g/dL (ref 6.5–8.1)

## 2023-08-16 LAB — WET PREP, GENITAL
Clue Cells Wet Prep HPF POC: NONE SEEN
Sperm: NONE SEEN
Trich, Wet Prep: NONE SEEN
WBC, Wet Prep HPF POC: >=10 — AB (ref <10)
Yeast Wet Prep HPF POC: NONE SEEN

## 2023-08-16 LAB — ABO/RH: ABO/RH(D): B POS

## 2023-08-16 LAB — HCG, QUANTITATIVE, PREGNANCY: hCG, Beta Chain, Quant, S: 58827 m[IU]/mL — ABNORMAL HIGH (ref <5)

## 2023-08-16 NOTE — Progress Notes (Signed)
   GYNECOLOGY OFFICE VISIT NOTE  History:   Joanne Santos is a 33 y.o. [id number] here today for "excessive bleeding." Pt presents with 4 days of heaving bleeding reporting bleeding through two overnight pads per day.  She was seen at [facility] 2/11 and had positive pregnancy test.  However facility did not have [place] so pregnancy of unknown location.  Told her to follow up for ectopic vs miscarriage.  Upon arrival soft Bps in 90s/50s, denies CP / lightheadedness.  Does endorse mild DOE, chills and N/V.  She also endorses 7/10 RLL and suprapubic pain intermittently in ways though currently not painful.    Past Medical History:  Diagnosis Date   BV (bacterial vaginosis)    Complication of anesthesia 03/23/2015   itched all over   Herpes genitalia    05/24/22- no recent flares    Past Surgical History:  Procedure Laterality Date   CESAREAN SECTION  2016   DILATION AND EVACUATION N/A 05/25/2022   Procedure: DILATATION AND EVACUATION;  Surgeon: Catalina Antigua, MD;  Location: [facility] OR;  Service: Gynecology;  Laterality: N/A;    The following portions of the patient's history were reviewed and updated as appropriate: allergies, current medications, past family history, past medical history, past social history, past surgical history and problem list.   Health Maintenance:  Normal pap and negative HRHPV on 02/25/22.    Review of Systems:  Pertinent items noted in HPI and remainder of comprehensive ROS otherwise negative.  Physical Exam:  BP (!) 93/59   Pulse 76   Ht 5\' 5"  (1.651 m)   Wt 195 lb (88.5 kg)   LMP 07/01/2023 (Exact Date)   BMI 32.45 kg/m  CONSTITUTIONAL: Well-developed, well-nourished female in no acute distress.  HEENT:  Normocephalic, atraumatic. External right and left ear normal. No scleral icterus.  NECK: Normal range of motion, supple, no masses noted on observation SKIN: No rash noted. Not diaphoretic. No erythema. No pallor. MUSCULOSKELETAL: Normal range of motion. No  edema noted. NEUROLOGIC: Alert and oriented to person, place, and time. Normal muscle tone coordination.  PSYCHIATRIC: Normal mood and affect. Normal behavior. Normal judgment and thought content. CARDIOVASCULAR: Normal heart rate noted RESPIRATORY: Effort and breath sounds normal, no problems with respiration noted ABDOMEN: No masses noted. No other overt distention noted.   PELVIC: Deferred  Labs and Imaging No results found for this or any previous visit (from the past week). No results found.    Assessment and Plan:      1. Pregnancy of unknown anatomic location (Primary)  2. Vaginal bleeding Sending to MAU for further evaluation of miscarriage vs ectopic. Pt with partner and thought stable to be driven by partner to MAU given GCS 15, A/O x 3.  Called MAU and made providers aware of soon arrival.   Routine preventative health maintenance measures emphasized. Please refer to After Visit Summary for other counseling recommendations.   Return in about 3 months (around 11/13/2023) for AUB.    Hessie Dibble, MD OB Fellow, Faculty [facility], [facility] 08/16/2023 3:22 PM

## 2023-08-16 NOTE — MAU Note (Signed)
.  Joanne Santos is a 33 y.o. at [redacted]w[redacted]d here in MAU reporting: she has been having small amount vag bleeding x 4 days along with pain and cramping in RLQ. Denies any recent intercourse. OB office also  told her her b/p was low 93/59   LMP:  Onset of complaint: 4 days Pain score: 7 Vitals:   08/16/23 1603  BP: 100/60  Pulse: 66  Resp: 19  Temp: 98.3 F (36.8 C)     FHT: n/a  Lab orders placed from triage: u/a wet prep, gc

## 2023-08-16 NOTE — MAU Provider Note (Signed)
Chief Complaint: Vaginal Bleeding  SUBJECTIVE HPI: Joanne Santos is a 33 y.o. G3P0011 at [redacted]w[redacted]d by LMP who presents to maternity admissions reporting vaginal bleeding, right lower quadrant pain.  Patient seen in office today for first pregnancy visit.  At that time was having borderline hypotension, 7 out of 10 right lower quadrant pain, vaginal bleeding and was sent to MAU for further evaluation given concern for ectopic pregnancy.  She notes that pain has entirely resolved after her arrival to MAU.  She does endorse continued light bleeding.  Denies fever/chills, urinary symptoms, change in vaginal discharge.  She does note that she is considering terminating this pregnancy.  Has not started taking prenatal vitamins.  Does not currently desire pregnancy at least within the next year.  HPI  Past Medical History:  Diagnosis Date   BV (bacterial vaginosis)    Complication of anesthesia 03/23/2015   itched all over   Herpes genitalia    05/24/22- no recent flares   Past Surgical History:  Procedure Laterality Date   CESAREAN SECTION  2016   DILATION AND EVACUATION N/A 05/25/2022   Procedure: DILATATION AND EVACUATION;  Surgeon: Catalina Antigua, MD;  Location: [facility] OR;  Service: Gynecology;  Laterality: N/A;   Social History   Socioeconomic History   Marital status: Single    Spouse name: Not on file   Number of children: Not on file   Years of education: Not on file   Highest education level: Not on file  Occupational History   Not on file  Tobacco Use   Smoking status: Never   Smokeless tobacco: Never  Vaping Use   Vaping status: Never Used  Substance and Sexual Activity   Alcohol use: Yes    Alcohol/week: 6.0 standard drinks of alcohol    Types: 3 Glasses of wine, 3 Shots of liquor per week    Comment: weekly   Drug use: No   Sexual activity: Yes    Birth control/protection: Implant  Other Topics Concern   Not on file  Social History Narrative   Not on file   Social  Drivers of Health   Financial Resource Strain: Not on file  Food Insecurity: Not on file  Transportation Needs: Not on file  Physical Activity: Not on file  Stress: Not on file  Social Connections: Unknown (11/15/2021)   Received from [facility], [facility]   Social Network    Social Network: Not on file  Intimate Partner Violence: Unknown (10/07/2021)   Received from [facility], [facility]   HITS    Physically Hurt: Not on file    Insult or Talk Down To: Not on file    Threaten Physical Harm: Not on file    Scream or Curse: Not on file   No current facility-administered medications on file prior to encounter.   Current Outpatient Medications on File Prior to Encounter  Medication Sig Dispense Refill   valACYclovir (VALTREX) 500 MG tablet TAKE 1 TABLET BY MOUTH 2 TIMES DAILY FOR 3 DAYS AS NEEDED FOR OUTBREAKS (Patient not taking: Reported on 07/18/2023) 24 tablet 2   No Known Allergies  ROS:  Pertinent positives/negatives listed above.  I have reviewed patient's Past Medical Hx, Surgical Hx, Family Hx, Social Hx, medications and allergies.   Physical Exam  Patient Vitals for the past 24 hrs:  BP Temp Pulse Resp  08/16/23 1603 100/60 98.3 F (36.8 C) 66 19   Constitutional: Well-developed, well-nourished female in no acute distress.  Cardiovascular:  normal rate Respiratory: normal effort GI: Abd soft, non-tender MS: Extremities nontender, no edema, normal ROM Neurologic: Alert and oriented x 4 GU: Neg CVAT  LAB RESULTS Results for orders placed or performed during the hospital encounter of 08/16/23 (from the past 24 hours)  CBC     Status: Abnormal   Collection Time: 08/16/23  4:09 PM  Result Value Ref Range   WBC 4.7 4.0 - 10.5 K/uL   RBC 3.95 3.87 - 5.11 MIL/uL   Hemoglobin 12.2 12.0 - 15.0 g/dL   HCT 96.0 (L) 45.4 - 09.8 %   MCV 90.1 80.0 - 100.0 fL   MCH 30.9 26.0 - 34.0 pg   MCHC 34.3 30.0 - 36.0 g/dL   RDW 11.9 14.7 - 82.9 %   Platelets 274 150  - 400 K/uL   nRBC 0.0 0.0 - 0.2 %  Comprehensive metabolic panel     Status: Abnormal   Collection Time: 08/16/23  4:09 PM  Result Value Ref Range   Sodium 134 (L) 135 - 145 mmol/L   Potassium 3.7 3.5 - 5.1 mmol/L   Chloride 106 98 - 111 mmol/L   CO2 21 (L) 22 - 32 mmol/L   Glucose, Bld 90 70 - 99 mg/dL   BUN 9 6 - 20 mg/dL   Creatinine, Ser 5.62 0.44 - 1.00 mg/dL   Calcium 8.7 (L) 8.9 - 10.3 mg/dL   Total Protein 7.2 6.5 - 8.1 g/dL   Albumin 3.9 3.5 - 5.0 g/dL   AST 16 15 - 41 U/L   ALT 13 0 - 44 U/L   Alkaline Phosphatase 26 (L) 38 - 126 U/L   Total Bilirubin 0.2 0.0 - 1.2 mg/dL   GFR, Estimated >13 >08 mL/min   Anion gap 7 5 - 15  ABO/Rh     Status: None   Collection Time: 08/16/23  4:09 PM  Result Value Ref Range   ABO/RH(D)      B POS Performed at [facility], [address], [place], [place] [address]   hCG, quantitative, pregnancy     Status: Abnormal   Collection Time: 08/16/23  4:09 PM  Result Value Ref Range   hCG, Beta Chain, [place] Longest 69,629 (H) <5 mIU/mL  Wet prep, genital     Status: Abnormal   Collection Time: 08/16/23  4:10 PM   Specimen: Vaginal  Result Value Ref Range   Yeast Wet Prep HPF POC NONE SEEN NONE SEEN   Trich, Wet Prep NONE SEEN NONE SEEN   Clue Cells Wet Prep HPF POC NONE SEEN NONE SEEN   WBC, Wet Prep HPF POC >=10 (A) <10   Sperm NONE SEEN   Urinalysis, Routine w reflex microscopic -Urine, Clean Catch     Status: Abnormal   Collection Time: 08/16/23  4:10 PM  Result Value Ref Range   Color, Urine YELLOW YELLOW   APPearance CLEAR CLEAR   Specific Gravity, Urine 1.020 1.005 - 1.030   pH 8.0 5.0 - 8.0   Glucose, UA NEGATIVE NEGATIVE mg/dL   Hgb urine dipstick SMALL (A) NEGATIVE   Bilirubin Urine NEGATIVE NEGATIVE   Ketones, ur NEGATIVE NEGATIVE mg/dL   Protein, ur NEGATIVE NEGATIVE mg/dL   Nitrite NEGATIVE NEGATIVE   Leukocytes,Ua NEGATIVE NEGATIVE   RBC / HPF 0-5 0 - 5 RBC/hpf   WBC, UA 0-5 0 - 5 WBC/hpf   Bacteria, UA RARE  (A) NONE SEEN   Squamous Epithelial / HPF 6-10 0 - 5 /HPF  Mucus PRESENT     --/--/B POS Performed at [facility], [address] [place], [place] [address]  (02/12 1609)  IMAGING No results found. Ultrasound reviewed which shows approximately 6-week gestation with intrauterine gestational sac, fetal pole, cardiac activity present.  MAU Management/MDM: Orders Placed This Encounter  Procedures   Wet prep, genital   US OB LESS THAN 14 WEEKS WITH OB TRANSVAGINAL   CBC   Comprehensive metabolic panel   hCG, quantitative, pregnancy   Urinalysis, Routine w reflex microscopic -Urine, Clean Catch   ABO/Rh   Discharge patient    No orders of the defined types were placed in this encounter.   Patient presents from office with right lower quadrant pain and vaginal bleeding without confirmed IUP at approximately 6 weeks.  Ectopic work-up obtained.  Ruled out UTI, vaginitis, ectopic pregnancy.  Workup revealed viable 6-week intrauterine pregnancy.  Reassuringly patient's vital signs, white count, abdominal exam, are all normal and reassuring against some other intra-abdominal process that would have caused earlier right lower quadrant pain.  Suspect possible MSK or constipation or GI spasm to cause pain. Patient plans to go to [facility] for abortion on Friday.  Gave her return precautions for miscarriage in case that is starting to occur.  She plans to follow-up with her OB/GYN at [facility] in order to start on some form of contraception, as she does not desire pregnancy within the next year.  ASSESSMENT 1. Normal IUP (intrauterine pregnancy) on prenatal ultrasound, first trimester   2. [redacted] weeks gestation of pregnancy   3. Vaginal spotting     PLAN Discharge home with strict return precautions. Allergies as of 08/16/2023   No Known Allergies      Medication List     STOP taking these medications    valACYclovir 500 MG tablet Commonly known as: Nicola Police, MD OB Fellow 08/16/2023  6:37 PM

## 2023-08-16 NOTE — Progress Notes (Signed)
33 y.o. GYN presents for heavy vaginal bleeding x 4, RLQ pain 7/10, changing overnight underpants 2/day. C/o chills, NV.  Denies fever.

## 2023-08-17 LAB — GC/CHLAMYDIA PROBE AMP (~~LOC~~) NOT AT ARMC
Chlamydia: NEGATIVE
Comment: NEGATIVE
Comment: NORMAL
Neisseria Gonorrhea: NEGATIVE

## 2023-08-29 ENCOUNTER — Telehealth: Payer: Self-pay

## 2023-08-29 NOTE — Telephone Encounter (Signed)
 Copied from CRM [id number]. Topic: Clinical - Request for Lab/Test Order >> Aug 29, 2023 12:47 PM Adele Barthel wrote: Reason for CRM:   Patient is needing a TB test performed. Unable to schedule patient and she requested call back from clinic once TB test order is placed.   CB# [phone] >> Aug 29, 2023  2:56 PM Irine Seal wrote: Patient returning missed call, she states she needs the TB blood test.

## 2023-08-30 ENCOUNTER — Other Ambulatory Visit: Payer: Self-pay

## 2023-08-30 DIAGNOSIS — Z111 Encounter for screening for respiratory tuberculosis: Secondary | ICD-10-CM

## 2023-09-04 NOTE — Telephone Encounter (Signed)
 Noted.

## 2023-09-06 ENCOUNTER — Other Ambulatory Visit

## 2023-09-06 DIAGNOSIS — Z0184 Encounter for antibody response examination: Secondary | ICD-10-CM

## 2023-09-06 DIAGNOSIS — Z111 Encounter for screening for respiratory tuberculosis: Secondary | ICD-10-CM

## 2023-09-08 LAB — B PERTUSSIS IGG/IGM AB
B pertussis IgG Ab: 3.7 {index} — ABNORMAL HIGH (ref 0.00–0.94)
B pertussis IgM Ab, Quant: 1.1 {index} — ABNORMAL HIGH (ref 0.0–0.9)

## 2023-09-09 LAB — QUANTIFERON-TB GOLD PLUS
Mitogen-NIL: >10 [IU]/mL
NIL: 0.04 [IU]/mL
QuantiFERON-TB Gold Plus: NEGATIVE
TB1-NIL: 0.01 [IU]/mL
TB2-NIL: 0.01 [IU]/mL

## 2023-09-13 ENCOUNTER — Telehealth: Payer: Self-pay

## 2023-09-13 NOTE — Telephone Encounter (Signed)
 Copied from CRM [id number]. Topic: Clinical - Lab/Test Results >> Sep 13, 2023  8:44 AM Adele Barthel wrote: Reason for CRM:   Patient was seen for results of her TB test recently. Her school has notified her that they will not accept the results as shown in her MyChart. The school is requesting a copy of the test results that show the physician's name, date, and results in hard copy form.   Patient is requesting the copy of test results and would like call back once this has been completed to pick up from the clinic.   # [phone]

## 2023-09-13 NOTE — Progress Notes (Signed)
 I have printed out TB results for pt and place at front at check in

## 2023-09-13 NOTE — Progress Notes (Signed)
 Please call patient:she needed a TB test: should have been just the quantiferon gold which is negative.  Not sure why a pertussis test was ordered? Can notify her TB test is negative

## 2024-01-04 ENCOUNTER — Other Ambulatory Visit: Payer: Self-pay | Admitting: Family Medicine

## 2024-01-04 ENCOUNTER — Encounter: Payer: Self-pay | Admitting: Family Medicine

## 2024-01-04 MED ORDER — VALACYCLOVIR HCL 500 MG PO TABS
ORAL_TABLET | ORAL | 0 refills | Status: AC
Start: 2024-01-04 — End: ?

## 2024-04-29 ENCOUNTER — Ambulatory Visit (INDEPENDENT_AMBULATORY_CARE_PROVIDER_SITE_OTHER): Admitting: Family Medicine

## 2024-04-29 ENCOUNTER — Other Ambulatory Visit (HOSPITAL_COMMUNITY)
Admission: RE | Admit: 2024-04-29 | Discharge: 2024-04-29 | Disposition: A | Discharge status: Home / Self Care | Source: Ambulatory Visit | Attending: Family Medicine | Admitting: Family Medicine

## 2024-04-29 VITALS — BP 106/82 | HR 84 | Temp 97.9°F | Ht 65.0 in | Wt 201.4 lb

## 2024-04-29 DIAGNOSIS — Z113 Encounter for screening for infections with a predominantly sexual mode of transmission: Secondary | ICD-10-CM | POA: Insufficient documentation

## 2024-04-29 DIAGNOSIS — Z0000 Encounter for general adult medical examination without abnormal findings: Secondary | ICD-10-CM | POA: Diagnosis not present

## 2024-04-29 LAB — COMPREHENSIVE METABOLIC PANEL WITH GFR
ALT: 13 U/L (ref 0–35)
AST: 15 U/L (ref 0–37)
Albumin: 4.4 g/dL (ref 3.5–5.2)
Alkaline Phosphatase: 35 U/L — ABNORMAL LOW (ref 39–117)
BUN: 14 mg/dL (ref 6–23)
CO2: 27 meq/L (ref 19–32)
Calcium: 9.2 mg/dL (ref 8.4–10.5)
Chloride: 103 meq/L (ref 96–112)
Creatinine, Ser: 0.72 mg/dL (ref 0.40–1.20)
GFR: 111.63 mL/min (ref >60.00)
Glucose, Bld: 94 mg/dL (ref 70–99)
Potassium: 4.3 meq/L (ref 3.5–5.1)
Sodium: 137 meq/L (ref 135–145)
Total Bilirubin: 0.3 mg/dL (ref 0.2–1.2)
Total Protein: 7.4 g/dL (ref 6.0–8.3)

## 2024-04-29 LAB — CBC WITH DIFFERENTIAL/PLATELET
Basophils Absolute: 0 K/uL (ref 0.0–0.1)
Basophils Relative: 0.5 % (ref 0.0–3.0)
Eosinophils Absolute: 0.1 K/uL (ref 0.0–0.7)
Eosinophils Relative: 2.5 % (ref 0.0–5.0)
HCT: 38.2 % (ref 36.0–46.0)
Hemoglobin: 12.7 g/dL (ref 12.0–15.0)
Lymphocytes Relative: 57.8 % — ABNORMAL HIGH (ref 12.0–46.0)
Lymphs Abs: 1.8 K/uL (ref 0.7–4.0)
MCHC: 33.2 g/dL (ref 30.0–36.0)
MCV: 92.2 fl (ref 78.0–100.0)
Monocytes Absolute: 0.4 K/uL (ref 0.1–1.0)
Monocytes Relative: 13.6 % — ABNORMAL HIGH (ref 3.0–12.0)
Neutro Abs: 0.8 K/uL — ABNORMAL LOW (ref 1.4–7.7)
Neutrophils Relative %: 25.6 % — ABNORMAL LOW (ref 43.0–77.0)
Platelets: 254 K/uL (ref 150.0–400.0)
RBC: 4.15 Mil/uL (ref 3.87–5.11)
RDW: 13.2 % (ref 11.5–15.5)
WBC: 3.2 K/uL — ABNORMAL LOW (ref 4.0–10.5)

## 2024-04-29 LAB — LIPID PANEL
Cholesterol: 177 mg/dL (ref 0–200)
HDL: 52.8 mg/dL (ref >39.00)
LDL Cholesterol: 115 mg/dL — ABNORMAL HIGH (ref 0–99)
NonHDL: 124.64
Total CHOL/HDL Ratio: 3
Triglycerides: 47 mg/dL (ref 0.0–149.0)
VLDL: 9.4 mg/dL (ref 0.0–40.0)

## 2024-04-29 LAB — TSH: TSH: 0.47 u[IU]/mL (ref 0.35–5.50)

## 2024-04-29 NOTE — Progress Notes (Signed)
 Subjective  Chief Complaint  Patient presents with   Annual Exam    Pt here for Annual exam and is currently fasting     HPI: Joanne Santos is a 33 y.o. female who presents to [facility] today for a Female Wellness Visit.   Wellness Visit: annual visit with health maintenance review and exam  HM: 33 year old healthy.  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  In a monogamous relationship.  Requesting STD screening.  Discussed birth control, uses calendar method and would be okay if she becomes pregnant.  Declines Tdap.  Declines flu shot.  Assessment  1. Annual physical exam   2. Screen for STD (sexually transmitted disease)      Plan  Female Wellness Visit: Age appropriate Health Maintenance and Prevention measures were discussed with patient. Included topics are cancer screening recommendations, ways to keep healthy (see AVS) including dietary and exercise recommendations, regular eye and dental care, use of seat belts, and avoidance of moderate alcohol use and tobacco use.  BMI: discussed patient's BMI and encouraged positive lifestyle modifications to help get to or maintain a target BMI. HM needs and immunizations were addressed and ordered. See below for orders. See HM and immunization section for updates. Routine labs and screening tests ordered including cmp, cbc and lipids where appropriate. Discussed recommendations regarding Vit D and calcium supplementation (see AVS) Screen for STDs.  Discussed with control.  Patient is not actively trying to be pregnant but would not be upset if she gets pregnant.  Follow up: 12 months for complete physical  Orders Placed This Encounter  Procedures   CBC with Differential/Platelet   Comprehensive metabolic panel with GFR   Lipid panel   TSH   HIV Antibody (routine testing w rflx)   RPR   No orders of the defined types were placed in this  encounter.      Body mass index is 33.51 kg/m. 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   Genital herpes 10/07/2016    Overview:  By pcr from lesion    Bacterial vaginosis 09/19/2016   Health Maintenance  Topic Date Due   Hepatitis B Vaccines 19-59 Average Risk (1 of 3 - 19+ 3-dose series) Never done   COVID-19 Vaccine (3 - 2025-26 season) 05/15/2024 (Originally 03/04/2024)   DTaP/Tdap/Td (2 - Td or Tdap) 04/29/2025 (Originally 12/18/2022)   Cervical Cancer Screening (HPV/Pap Cotest)  02/26/2027   HPV VACCINES  Completed   Hepatitis C Screening  Completed   HIV Screening  Completed   Pneumococcal Vaccine  Aged Out   Meningococcal B Vaccine  Aged Out   Immunization History  Administered Date(s) Administered   HPV Quadrivalent 12/14/2007, 11/03/2009, 12/17/2012   Influenza,inj,Quad PF,6+ Mos 02/24/2020   Moderna Sars-Covid-2 Vaccination 02/24/2020, 03/23/2020   Tdap 12/17/2012   We updated and reviewed the patient's past history in detail and it is documented below. Allergies: Patient has no known allergies. Past Medical History Patient  has a past medical history of BV (bacterial vaginosis), Complication of anesthesia (03/23/2015), and Herpes genitalia. Past Surgical History Patient  has a past surgical history that includes Cesarean section (2016) and Dilation and evacuation (N/A, 05/25/2022). Family History:  Patient family history includes Diabetes in her father and maternal grandmother; Early death in her mother; Hypertension in her father and sister; Stroke in her maternal grandmother. Social History:  Patient  reports that she has never smoked. She has never used smokeless tobacco. She reports current alcohol use of about 6.0 standard drinks of alcohol per week. She reports that she does not use  drugs.  Review of Systems: Constitutional: negative for fever or malaise Ophthalmic: negative for photophobia, double vision or loss of vision Cardiovascular: negative for chest pain, dyspnea on exertion, or new LE swelling Respiratory: negative for SOB or persistent cough Gastrointestinal: negative for abdominal pain, change in bowel habits or melena Genitourinary: negative for dysuria or gross hematuria, no abnormal uterine bleeding or disharge Musculoskeletal: negative for new gait disturbance or muscular weakness Integumentary: negative for new or persistent rashes, no breast lumps Neurological: negative for TIA or stroke symptoms Psychiatric: negative for SI or delusions Allergic/Immunologic: negative for hives Patient Care Team    Relationship Specialty Notifications Start End  Jodie Lavern CROME, MD PCP - General Family Medicine  04/11/17   Mollie Greig PARAS, MD Consulting Physician Dermatology  04/28/23     Objective  Vitals: BP 106/82   Pulse 84   Temp 97.9 F (36.6 C)   Ht 5' 5 (1.651 m)   Wt 201 lb 6.4 oz (91.4 kg)   LMP 07/01/2023 (Exact Date)   SpO2 98%   BMI 33.51 kg/m  General:  Well developed, well nourished, no acute distress  Psych:  Alert and orientedx3,normal mood and affect HEENT:  Normocephalic, atraumatic, non-icteric sclera, supple neck without adenopathy, mass or thyromegaly Cardiovascular:  Normal S1, S2, RRR without gallop, rub or murmur Respiratory:  Good breath sounds bilaterally, CTAB with normal respiratory effort Gastrointestinal: normal bowel sounds, soft, non-tender, no noted masses. No HSM MSK: no deformities, contusions. Joints are without erythema or swelling. Spine and CVA region are nontender Skin:  Warm, no rashes or suspicious lesions noted Neurologic:    Mental status is normal. Gross motor and sensory exams are normal. Normal gait. No tremor   Commons side effects, risks, benefits, and alternatives for medications and treatment plan  prescribed today were discussed, and the patient expressed understanding of the given instructions. Patient is instructed to call or message via MyChart if he/she has any questions or concerns regarding our treatment plan. No barriers to understanding were identified. We discussed Red Flag symptoms and signs in detail. Patient expressed understanding regarding what to do in case of urgent or emergency type symptoms.  Medication list was reconciled, printed and provided to the patient in AVS. Patient instructions and summary information was reviewed with the patient as documented in the AVS. This note was prepared with assistance of Dragon voice recognition software. Occasional wrong-word or sound-a-like substitutions may have occurred due to the inherent limitations of voice recognition software

## 2024-04-29 NOTE — Patient Instructions (Signed)
 Please return in 12 months for your annual complete physical; please come fasting.   I will release your lab results to you on your MyChart account with further instructions. You may see the results before I do, but when I review them I will send you a message with my report or have my assistant call you if things need to be discussed. Please reply to my message with any questions. Thank you!   If you have any questions or concerns, please don't hesitate to send me a message via MyChart or call the office at [phone]. Thank you for visiting with us  today! It's our pleasure caring for you.   Please do these things to maintain good health!  Exercise at least 30-45 minutes a day,  4-5 days a week.  Eat a low-fat diet with lots of fruits and vegetables, up to 7-9 servings per day. Drink plenty of water daily. Try to drink 8 8oz glasses per day. Seatbelts can save your life. Always wear your seatbelt. Place Smoke Detectors on every level of your home and check batteries every year. Schedule an appointment with an eye doctor for an eye exam every 1-2 years Safe sex - use condoms to protect yourself from STDs if you could be exposed to these types of infections. Use birth control if you do not want to become pregnant and are sexually active. Avoid heavy alcohol use. If you drink, keep it to less than 2 drinks/day and not every day. Health Care Power of Attorney.  Choose someone you trust that could speak for you if you became unable to speak for yourself. Depression is common in our stressful world.If you're feeling down or losing interest in things you normally enjoy, please come in for a visit. If anyone is threatening or hurting you, please get help. Physical or Emotional Violence is never OK.

## 2024-04-30 LAB — RPR: RPR Ser Ql: NONREACTIVE

## 2024-04-30 LAB — URINE CYTOLOGY ANCILLARY ONLY
Chlamydia: NEGATIVE
Comment: NEGATIVE
Comment: NORMAL
Neisseria Gonorrhea: NEGATIVE

## 2024-04-30 LAB — HIV ANTIBODY (ROUTINE TESTING W REFLEX)
HIV 1&2 Ab, 4th Generation: NONREACTIVE
HIV FINAL INTERPRETATION: NEGATIVE

## 2024-05-01 ENCOUNTER — Telehealth: Payer: Self-pay

## 2024-05-01 ENCOUNTER — Ambulatory Visit: Payer: Self-pay | Admitting: Family Medicine

## 2024-05-01 NOTE — Telephone Encounter (Signed)
 Copied from CRM [id number]. Topic: Clinical - Lab/Test Results >> May 01, 2024 11:41 AM Laymon HERO wrote: Reason for CRM: Patient asking for Dr Jodie or nurse to call her to go over lab results  Message sent to PCP to address

## 2024-05-01 NOTE — Progress Notes (Signed)
 See mychart note Dear Ms. Meno, Your lab results look good overall. Your WBC and CBC show you may have had a recent viral infection; all other testing is normal.  No changes are needed at this time. Sincerely, Dr. Jodie

## 2024-05-03 ENCOUNTER — Telehealth: Payer: Self-pay

## 2024-05-03 NOTE — Telephone Encounter (Signed)
 Copied from CRM #[id number]. Topic: Clinical - Request for Lab/Test Order >> May 03, 2024 11:36 AM Joanne Santos wrote: Reason for CRM: Patient is wanting test orders entered to check for a yeast infection and bacterial vaginosis.  Message sent to pcp to address

## 2024-05-07 ENCOUNTER — Telehealth: Payer: Self-pay

## 2024-05-07 ENCOUNTER — Other Ambulatory Visit: Payer: Self-pay

## 2024-05-07 DIAGNOSIS — B9689 Other specified bacterial agents as the cause of diseases classified elsewhere: Secondary | ICD-10-CM

## 2024-05-07 DIAGNOSIS — B379 Candidiasis, unspecified: Secondary | ICD-10-CM

## 2024-05-07 NOTE — Addendum Note (Signed)
 Addended by: NYLE MACINTOSH A on: 05/07/2024 04:36 PM   Modules accepted: Orders

## 2024-05-07 NOTE — Telephone Encounter (Signed)
Orders has been placed.

## 2024-05-07 NOTE — Telephone Encounter (Signed)
 Copied from CRM #[id number]. Topic: Clinical - Request for Lab/Test Order >> May 03, 2024 11:36 AM Adelita E wrote: Reason for CRM: Patient is wanting test orders entered to check for a yeast infection and bacterial vaginosis. >> May 07, 2024 10:23 AM Pinkey ORN wrote: Patient called, states she was disconnected with previous specialist due to her phone dying. But I did inform patient that a message was sent in regards to the requested orders, and that once the orders are placed she would receive a call back to be properly scheduled.  >> May 07, 2024  9:42 AM Terri MATSU wrote: Patient is calling again regarding test results. Order is still not placed in yet but she did state for the order to be placed. Can you call the patient when it's in so she can set up appointment . Callback number [phone]  Labs has been placed

## 2024-05-08 ENCOUNTER — Other Ambulatory Visit (HOSPITAL_COMMUNITY)
Admission: RE | Admit: 2024-05-08 | Discharge: 2024-05-08 | Disposition: A | Discharge status: Home / Self Care | Source: Ambulatory Visit | Attending: Family Medicine | Admitting: Family Medicine

## 2024-05-08 ENCOUNTER — Other Ambulatory Visit

## 2024-05-08 DIAGNOSIS — B9689 Other specified bacterial agents as the cause of diseases classified elsewhere: Secondary | ICD-10-CM

## 2024-05-08 DIAGNOSIS — B379 Candidiasis, unspecified: Secondary | ICD-10-CM

## 2024-05-08 DIAGNOSIS — N760 Acute vaginitis: Secondary | ICD-10-CM | POA: Diagnosis present

## 2024-05-09 ENCOUNTER — Ambulatory Visit: Payer: Self-pay | Admitting: Family Medicine

## 2024-05-09 LAB — CERVICOVAGINAL ANCILLARY ONLY
Bacterial Vaginitis (gardnerella): NEGATIVE
Candida Glabrata: POSITIVE — AB
Candida Vaginitis: NEGATIVE
Comment: NEGATIVE
Comment: NEGATIVE
Comment: NEGATIVE
Comment: NEGATIVE
Trichomonas: NEGATIVE

## 2024-05-09 MED ORDER — FLUCONAZOLE 150 MG PO TABS: ORAL_TABLET | ORAL | 0 refills | Status: AC

## 2024-05-09 NOTE — Progress Notes (Signed)
 See mychart note Dear Ms. Levinson, Your vaginal swab shows a yeast infection. I have sent in diflucan  for you. It is negative for BV. Sincerely, Dr. Jodie

## 2024-07-25 ENCOUNTER — Telehealth: Payer: Self-pay

## 2024-07-25 NOTE — Telephone Encounter (Signed)
 Copied from CRM #[id number]. Topic: General - Other >> Jul 24, 2024 11:06 AM Revonda D wrote: Reason for CRM: Pt stated that she needs Dr.Andy to sign some physical forms and stated that she will be faxing over the forms today.

## 2024-07-25 NOTE — Telephone Encounter (Signed)
 Copied from CRM #[id number]. Topic: General - Other >> Jul 24, 2024 11:06 AM Revonda D wrote: Reason for CRM: Pt stated that she needs Dr.Andy to sign some physical forms and stated that she will be faxing over the forms today.  Form has been given to pcp for completion

## 2024-07-26 ENCOUNTER — Telehealth: Payer: Self-pay | Admitting: Family Medicine

## 2024-07-26 NOTE — Telephone Encounter (Signed)
 Copied from CRM #[id number]. Topic: General - Other >> Jul 24, 2024 11:06 AM Revonda D wrote: Reason for CRM: Pt stated that she needs Dr.Andy to sign some physical forms and stated that she will be faxing over the forms today.

## 2024-07-26 NOTE — Telephone Encounter (Signed)
 Noted. Will wait for forms.   Copied from CRM #[id number]. Topic: General - Other >> Jul 24, 2024 11:06 AM Revonda D wrote: Reason for CRM: Pt stated that she needs Dr.Andy to sign some physical forms and stated that she will be faxing over the forms today.
